# Patient Record
Sex: Female | Born: 1950
Health system: Southern US, Community
[De-identification: ages and names within clinical notes are randomized; demographics above are authoritative.]

## PROBLEM LIST (undated history)

## (undated) DIAGNOSIS — E669 Obesity, unspecified: Secondary | ICD-10-CM

## (undated) DIAGNOSIS — I10 Essential (primary) hypertension: Secondary | ICD-10-CM

## (undated) DIAGNOSIS — E559 Vitamin D deficiency, unspecified: Secondary | ICD-10-CM

## (undated) DIAGNOSIS — R7303 Prediabetes: Secondary | ICD-10-CM

## (undated) DIAGNOSIS — E785 Hyperlipidemia, unspecified: Secondary | ICD-10-CM

## (undated) HISTORY — DX: Obesity, unspecified: E66.9

## (undated) HISTORY — DX: Prediabetes: R73.03

## (undated) HISTORY — PX: COLONOSCOPY: SHX174

## (undated) HISTORY — DX: Hyperlipidemia, unspecified: E78.5

## (undated) HISTORY — DX: Vitamin D deficiency, unspecified: E55.9

## (undated) HISTORY — DX: Essential (primary) hypertension: I10

## (undated) HISTORY — PX: ENDOMETRIAL ABLATION: SHX621

---

## 2003-06-05 ENCOUNTER — Ambulatory Visit (HOSPITAL_COMMUNITY): Admission: RE | Admit: 2003-06-05 | Discharge: 2003-06-05 | Payer: Self-pay

## 2004-08-11 ENCOUNTER — Ambulatory Visit: Payer: Self-pay | Admitting: Family Medicine

## 2004-09-08 ENCOUNTER — Ambulatory Visit: Payer: Self-pay | Admitting: Family Medicine

## 2004-10-09 ENCOUNTER — Ambulatory Visit: Payer: Self-pay | Admitting: Family Medicine

## 2004-11-08 ENCOUNTER — Ambulatory Visit: Payer: Self-pay | Admitting: Family Medicine

## 2004-11-20 ENCOUNTER — Ambulatory Visit: Payer: Self-pay | Admitting: Family Medicine

## 2004-12-09 ENCOUNTER — Ambulatory Visit: Payer: Self-pay | Admitting: Family Medicine

## 2004-12-10 ENCOUNTER — Ambulatory Visit: Payer: Self-pay

## 2005-01-09 ENCOUNTER — Ambulatory Visit: Payer: Self-pay | Admitting: Family Medicine

## 2005-02-08 ENCOUNTER — Ambulatory Visit: Payer: Self-pay | Admitting: Family Medicine

## 2005-03-11 ENCOUNTER — Ambulatory Visit: Payer: Self-pay | Admitting: Family Medicine

## 2005-04-10 ENCOUNTER — Ambulatory Visit: Payer: Self-pay | Admitting: Family Medicine

## 2005-05-11 ENCOUNTER — Ambulatory Visit: Payer: Self-pay | Admitting: Family Medicine

## 2005-07-01 ENCOUNTER — Ambulatory Visit: Payer: Self-pay | Admitting: Family Medicine

## 2007-02-24 ENCOUNTER — Ambulatory Visit: Payer: Self-pay | Admitting: Family Medicine

## 2008-10-24 ENCOUNTER — Ambulatory Visit: Payer: Self-pay | Admitting: Family Medicine

## 2010-09-04 ENCOUNTER — Ambulatory Visit: Payer: Self-pay | Admitting: Family Medicine

## 2010-12-16 DIAGNOSIS — IMO0002 Reserved for concepts with insufficient information to code with codable children: Secondary | ICD-10-CM | POA: Insufficient documentation

## 2012-09-08 DIAGNOSIS — N951 Menopausal and female climacteric states: Secondary | ICD-10-CM | POA: Insufficient documentation

## 2012-09-23 ENCOUNTER — Ambulatory Visit: Payer: Self-pay | Admitting: Family Medicine

## 2014-04-18 ENCOUNTER — Ambulatory Visit: Payer: Self-pay | Admitting: Family Medicine

## 2014-04-27 ENCOUNTER — Ambulatory Visit: Payer: Self-pay | Admitting: Family Medicine

## 2014-12-11 ENCOUNTER — Other Ambulatory Visit: Payer: Self-pay | Admitting: Family Medicine

## 2015-01-10 ENCOUNTER — Telehealth: Payer: Self-pay | Admitting: Family Medicine

## 2015-01-10 ENCOUNTER — Other Ambulatory Visit: Payer: Self-pay | Admitting: Family Medicine

## 2015-01-10 DIAGNOSIS — Z0000 Encounter for general adult medical examination without abnormal findings: Secondary | ICD-10-CM

## 2015-01-10 NOTE — Telephone Encounter (Signed)
Sent rx to pharmacy and put in referral for mammogram

## 2015-01-10 NOTE — Telephone Encounter (Signed)
Requesting refill on Losartan, please send to [facility]. Last seen on 02-21-14 but does have her annual physical scheduled for 02/26/15 @11a . Requesting a mammogram referral as well

## 2015-01-11 MED ORDER — LOSARTAN POTASSIUM-HCTZ 100-12.5 MG PO TABS: 1.0000 | ORAL_TABLET | Freq: Every day | ORAL | Status: DC

## 2015-01-11 NOTE — Telephone Encounter (Signed)
Patient informed. 

## 2015-02-08 ENCOUNTER — Telehealth: Payer: Self-pay | Admitting: Family Medicine

## 2015-02-08 ENCOUNTER — Other Ambulatory Visit: Payer: Self-pay | Admitting: Family Medicine

## 2015-02-08 DIAGNOSIS — N6459 Other signs and symptoms in breast: Secondary | ICD-10-CM

## 2015-02-08 NOTE — Telephone Encounter (Signed)
Spoke with pt and sent new order for her.

## 2015-02-08 NOTE — Telephone Encounter (Signed)
Requesting return call pertaining to the mammogram appointment. She said in addition to the diagnostic they are also needing the okay to do the right breast ultrasound, because they always do the two together.

## 2015-02-08 NOTE — Telephone Encounter (Signed)
Sent order for ultrasound.

## 2015-02-08 NOTE — Telephone Encounter (Signed)
Patient received a letter December 2015 to due a 6 month follow up for diagnostic right breast. An order was placed sept 2016 for her annual. This should be cancelled and her diagnostic should be scheduled. Please return patient call once this is completed.

## 2015-02-11 ENCOUNTER — Telehealth: Payer: Self-pay | Admitting: Family Medicine

## 2015-02-11 ENCOUNTER — Other Ambulatory Visit: Payer: Self-pay | Admitting: Internal Medicine

## 2015-02-11 DIAGNOSIS — N6459 Other signs and symptoms in breast: Secondary | ICD-10-CM

## 2015-02-11 NOTE — Telephone Encounter (Signed)
Spoke with pt and called Levada Dy at [facility] to get the correct order sent so that she may schedule her ultrasound

## 2015-02-11 NOTE — Telephone Encounter (Signed)
Pt is needing to speak with Tashia regarding the scheduling of her mammogram.  Please call patient as soon as possible.

## 2015-02-15 ENCOUNTER — Ambulatory Visit
Admission: RE | Admit: 2015-02-15 | Discharge: 2015-02-15 | Disposition: A | Discharge status: Home / Self Care | Payer: BC Managed Care – PPO | Source: Ambulatory Visit | Attending: Family Medicine | Admitting: Family Medicine

## 2015-02-15 ENCOUNTER — Other Ambulatory Visit: Payer: Self-pay | Admitting: Family Medicine

## 2015-02-15 DIAGNOSIS — N6459 Other signs and symptoms in breast: Secondary | ICD-10-CM

## 2015-02-15 DIAGNOSIS — R928 Other abnormal and inconclusive findings on diagnostic imaging of breast: Secondary | ICD-10-CM | POA: Diagnosis not present

## 2015-02-15 DIAGNOSIS — N6489 Other specified disorders of breast: Secondary | ICD-10-CM | POA: Diagnosis present

## 2015-02-26 ENCOUNTER — Encounter: Payer: Self-pay | Admitting: Family Medicine

## 2015-03-12 ENCOUNTER — Other Ambulatory Visit: Payer: Self-pay | Admitting: Emergency Medicine

## 2015-03-12 MED ORDER — LOSARTAN POTASSIUM-HCTZ 100-12.5 MG PO TABS: 1.0000 | ORAL_TABLET | Freq: Every day | ORAL | Status: DC

## 2015-03-18 ENCOUNTER — Telehealth: Payer: Self-pay | Admitting: Family Medicine

## 2015-03-18 DIAGNOSIS — Z0000 Encounter for general adult medical examination without abnormal findings: Secondary | ICD-10-CM

## 2015-03-18 NOTE — Telephone Encounter (Signed)
Pt would like to know if she can get a laborder ready for her to pick up. She has a CPE scheduled for 03/21/15 and wants to go get her labs done.

## 2015-03-19 NOTE — Telephone Encounter (Signed)
Lab order printed and placed in file cabinet to be picked up. Please inform pt she must be fasting 8 hours prior to giving blood.

## 2015-03-19 NOTE — Telephone Encounter (Signed)
Pt informed

## 2015-03-21 ENCOUNTER — Other Ambulatory Visit: Payer: Self-pay | Admitting: Emergency Medicine

## 2015-03-21 ENCOUNTER — Encounter: Payer: Self-pay | Admitting: Family Medicine

## 2015-03-21 NOTE — Telephone Encounter (Signed)
Patient appointment was reschedule. 1 month refill called in

## 2015-04-14 ENCOUNTER — Other Ambulatory Visit: Payer: Self-pay | Admitting: Family Medicine

## 2015-04-25 ENCOUNTER — Encounter: Payer: Self-pay | Admitting: Family Medicine

## 2015-04-25 ENCOUNTER — Ambulatory Visit (INDEPENDENT_AMBULATORY_CARE_PROVIDER_SITE_OTHER): Payer: BC Managed Care – PPO | Admitting: Family Medicine

## 2015-04-25 VITALS — BP 120/64 | HR 85 | Temp 98.6°F | Resp 18 | Ht 62.0 in | Wt 195.4 lb

## 2015-04-25 DIAGNOSIS — I10 Essential (primary) hypertension: Secondary | ICD-10-CM

## 2015-04-25 DIAGNOSIS — N951 Menopausal and female climacteric states: Secondary | ICD-10-CM | POA: Diagnosis not present

## 2015-04-25 DIAGNOSIS — Z1211 Encounter for screening for malignant neoplasm of colon: Secondary | ICD-10-CM | POA: Diagnosis not present

## 2015-04-25 DIAGNOSIS — D219 Benign neoplasm of connective and other soft tissue, unspecified: Secondary | ICD-10-CM | POA: Insufficient documentation

## 2015-04-25 DIAGNOSIS — Z0000 Encounter for general adult medical examination without abnormal findings: Secondary | ICD-10-CM | POA: Diagnosis not present

## 2015-04-25 DIAGNOSIS — E668 Other obesity: Secondary | ICD-10-CM | POA: Diagnosis not present

## 2015-04-25 DIAGNOSIS — Z1239 Encounter for other screening for malignant neoplasm of breast: Secondary | ICD-10-CM | POA: Diagnosis not present

## 2015-04-25 DIAGNOSIS — D250 Submucous leiomyoma of uterus: Secondary | ICD-10-CM

## 2015-04-25 DIAGNOSIS — E1159 Type 2 diabetes mellitus with other circulatory complications: Secondary | ICD-10-CM | POA: Insufficient documentation

## 2015-04-25 DIAGNOSIS — IMO0002 Reserved for concepts with insufficient information to code with codable children: Secondary | ICD-10-CM

## 2015-04-25 DIAGNOSIS — Z780 Asymptomatic menopausal state: Secondary | ICD-10-CM | POA: Diagnosis not present

## 2015-04-25 MED ORDER — LOSARTAN POTASSIUM-HCTZ 100-12.5 MG PO TABS: 1.0000 | ORAL_TABLET | Freq: Every day | ORAL | Status: DC

## 2015-04-25 NOTE — Progress Notes (Signed)
Name: Madeline Pitts   MRN: [id number]    DOB: [birth year]   Date:04/25/2015       Progress Note  Subjective  Chief Complaint  Chief Complaint  Patient presents with  . Annual Exam    HPI   63 year old presenting for annual H&P. Baseline problems are stable  Past Medical History  Diagnosis Date  . Obesity   . Hypertension   . Vitamin D deficiency     Social History  Substance Use Topics  . Smoking status: Never Smoker   . Smokeless tobacco: Not on file  . Alcohol Use: No     Current outpatient prescriptions:  .  estrogen, conjugated,-medroxyprogesterone (PREMPRO) 0.45-1.5 MG tablet, Take by mouth., Disp: , Rfl:  .  Ascorbic Acid (VITAMIN C) 1000 MG tablet, Take by mouth., Disp: , Rfl:  .  losartan-hydrochlorothiazide (HYZAAR) 100-12.5 MG tablet, TAKE 1 TABLET BY MOUTH EVERY DAY, Disp: 30 tablet, Rfl: 0 .  Multiple Vitamin (MULTI-VITAMINS) TABS, Take by mouth., Disp: , Rfl:  .  Omega-3 Fatty Acids (FISH OIL) 1000 MG CAPS, Take by mouth., Disp: , Rfl:   Allergies  Allergen Reactions  . Penicillins Nausea Only    Review of Systems  Constitutional: Negative for fever, chills and weight loss.       Modestly obese and in no acute distress  HENT: Positive for congestion. Negative for hearing loss, sore throat and tinnitus.   Eyes: Negative for blurred vision, double vision and redness.  Respiratory: Negative for cough, hemoptysis and shortness of breath.   Cardiovascular: Negative for chest pain, palpitations, orthopnea, claudication and leg swelling.  Gastrointestinal: Negative for heartburn, nausea, vomiting, diarrhea, constipation and blood in stool.  Genitourinary: Negative for dysuria, urgency, frequency and hematuria.  Musculoskeletal: Negative for myalgias, back pain, joint pain, falls and neck pain.  Skin: Negative for itching.  Neurological: Negative for dizziness, tingling, tremors, focal weakness, seizures, loss of consciousness, weakness and headaches.   Endo/Heme/Allergies: Does not bruise/bleed easily.  Psychiatric/Behavioral: Negative for depression and substance abuse. The patient is not nervous/anxious and does not have insomnia.      Objective  Filed Vitals:   04/25/15 1105  BP: 120/64  Pulse: 85  Temp: 98.6 F (37 C)  Resp: 18  Height: 5\' 2"  (1.575 m)  Weight: 195 lb 7 oz (88.65 kg)  SpO2: 96%     Physical Exam  Constitutional: She is oriented to person, place, and time.  Modestly obese and in no acute distress  HENT:  Head: Normocephalic.  Eyes: EOM are normal. Pupils are equal, round, and reactive to light.  Neck: Normal range of motion. No thyromegaly present.  Cardiovascular: Normal rate, regular rhythm and normal heart sounds.   No murmur heard. Pulmonary/Chest: Effort normal and breath sounds normal.  Breasts are without asymmetry dimpling masses or tenderness  Abdominal: Soft. Bowel sounds are normal.  Musculoskeletal: Normal range of motion. She exhibits no edema.  Neurological: She is alert and oriented to person, place, and time. No cranial nerve deficit. Gait normal.  Skin: Skin is warm and dry. No rash noted.  Psychiatric: Memory and affect normal.      Assessment & Plan   1. Annual physical exam  - CBC - Comprehensive Metabolic Panel (CMET) - Lipid panel - TSH -   2. Breast cancer screening  MM Digital Screening; Future  3. Colon cancer screening - Ambulatory referral to Gastroenterology  4. Essential hypertension Well-controlled  5. Submucous leiomyoma of uterus Per her gynecologist who  is following regularly and has had a recent Pap smear within the last year which was normal  6. Post menopausal syndrome   7. Menopausal symptom   8. Adult BMI 30+ Handout on diet and obesity

## 2015-04-26 ENCOUNTER — Telehealth: Payer: Self-pay | Admitting: Emergency Medicine

## 2015-04-26 LAB — COMPREHENSIVE METABOLIC PANEL
A/G RATIO: 1.8 (ref 1.1–2.5)
ALBUMIN: 4.4 g/dL (ref 3.6–4.8)
ALT: 23 IU/L (ref 0–32)
AST: 20 IU/L (ref 0–40)
Alkaline Phosphatase: 84 IU/L (ref 39–117)
BILIRUBIN TOTAL: 0.2 mg/dL (ref 0.0–1.2)
BUN / CREAT RATIO: 15 (ref 11–26)
BUN: 14 mg/dL (ref 8–27)
CHLORIDE: 97 mmol/L (ref 96–106)
CO2: 26 mmol/L (ref 18–29)
Calcium: 10 mg/dL (ref 8.7–10.3)
Creatinine, Ser: 0.92 mg/dL (ref 0.57–1.00)
GFR calc non Af Amer: 67 mL/min/{1.73_m2} (ref >59)
GFR, EST AFRICAN AMERICAN: 77 mL/min/{1.73_m2} (ref >59)
GLOBULIN, TOTAL: 2.4 g/dL (ref 1.5–4.5)
Glucose: 110 mg/dL — ABNORMAL HIGH (ref 65–99)
POTASSIUM: 4.4 mmol/L (ref 3.5–5.2)
SODIUM: 141 mmol/L (ref 134–144)
TOTAL PROTEIN: 6.8 g/dL (ref 6.0–8.5)

## 2015-04-26 LAB — LIPID PANEL
CHOL/HDL RATIO: 4.1 ratio (ref 0.0–4.4)
Cholesterol, Total: 203 mg/dL — ABNORMAL HIGH (ref 100–199)
HDL: 50 mg/dL (ref >39)
LDL CALC: 137 mg/dL — AB (ref 0–99)
Triglycerides: 80 mg/dL (ref 0–149)
VLDL Cholesterol Cal: 16 mg/dL (ref 5–40)

## 2015-04-26 LAB — TSH: TSH: 1.23 u[IU]/mL (ref 0.450–4.500)

## 2015-04-26 NOTE — Addendum Note (Signed)
Addended by: Lolita Rieger D on: 04/26/2015 09:43 AM   Modules accepted: Orders

## 2015-04-26 NOTE — Telephone Encounter (Signed)
Patient notified of lab results. Refused cholesterol medication at this time. Would like to try and bring down the numbers with diet and exercise.

## 2015-04-29 ENCOUNTER — Telehealth: Payer: Self-pay | Admitting: Gastroenterology

## 2015-04-29 ENCOUNTER — Other Ambulatory Visit: Payer: Self-pay

## 2015-04-29 NOTE — Telephone Encounter (Signed)
Gastroenterology Pre-Procedure Review  Request Date: 05-27-2014 Requesting Physician: Dr.   PATIENT REVIEW QUESTIONS: The patient responded to the following health history questions as indicated:    1. Are you having any GI issues? no 2. Do you have a personal history of Polyps? no 3. Do you have a family history of Colon Cancer or Polyps? no 4. Diabetes Mellitus? no 5. Joint replacements in the past 12 months?no 6. Major health problems in the past 3 months?no 7. Any artificial heart valves, MVP, or defibrillator?no    MEDICATIONS & ALLERGIES:    Patient reports the following regarding taking any anticoagulation/antiplatelet therapy:   Plavix, Coumadin, Eliquis, Xarelto, Lovenox, Pradaxa, Brilinta, or Effient? no Aspirin? no  Patient confirms/reports the following medications:  Current Outpatient Prescriptions  Medication Sig Dispense Refill   Ascorbic Acid (VITAMIN C) 1000 MG tablet Take by mouth.     estrogen, conjugated,-medroxyprogesterone (PREMPRO) 0.45-1.5 MG tablet Take by mouth.     losartan-hydrochlorothiazide (HYZAAR) 100-12.5 MG tablet Take 1 tablet by mouth daily. 30 tablet 6   Multiple Vitamin (MULTI-VITAMINS) TABS Take by mouth.     Omega-3 Fatty Acids (FISH OIL) 1000 MG CAPS Take by mouth.     No current facility-administered medications for this visit.    Patient confirms/reports the following allergies:  Allergies  Allergen Reactions   Penicillins Nausea Only    No orders of the defined types were placed in this encounter.    AUTHORIZATION INFORMATION Primary Insurance: 1D#: Group #:  Secondary Insurance: 1D#: Group #:  SCHEDULE INFORMATION: Date: 05-27-2014 Time: Location:[facility]

## 2015-05-22 ENCOUNTER — Telehealth: Payer: Self-pay | Admitting: Gastroenterology

## 2015-05-22 NOTE — Telephone Encounter (Signed)
Patient called to ask if Dr.Wohl was in network with her insurance [employer] i told her i was sure he was but she may want to check with her insurance first. Patient was scheduled for a colonoscopy 1/17 but decided to cancel and will call back to r/s

## 2015-05-28 ENCOUNTER — Encounter: Admission: RE | Payer: Self-pay | Source: Ambulatory Visit

## 2015-05-28 ENCOUNTER — Ambulatory Visit
Admission: RE | Admit: 2015-05-28 | Payer: BC Managed Care – PPO | Source: Ambulatory Visit | Admitting: Gastroenterology

## 2015-05-28 SURGERY — COLONOSCOPY WITH PROPOFOL
Anesthesia: General

## 2015-06-17 ENCOUNTER — Other Ambulatory Visit: Payer: Self-pay

## 2015-06-17 MED ORDER — LOSARTAN POTASSIUM-HCTZ 100-12.5 MG PO TABS: 1.0000 | ORAL_TABLET | Freq: Every day | ORAL | Status: DC

## 2015-09-24 ENCOUNTER — Telehealth: Payer: Self-pay | Admitting: Family Medicine

## 2015-09-24 NOTE — Telephone Encounter (Signed)
Appointment scheduled with Dr Ancil Boozer for June 20. Requesting refill on Losartan. Please [facility] to [facility]

## 2015-09-26 MED ORDER — LOSARTAN POTASSIUM-HCTZ 100-12.5 MG PO TABS: 1.0000 | ORAL_TABLET | Freq: Every day | ORAL | Status: DC

## 2015-10-22 ENCOUNTER — Ambulatory Visit
Admission: RE | Admit: 2015-10-22 | Discharge: 2015-10-22 | Disposition: A | Discharge status: Home / Self Care | Payer: BC Managed Care – PPO | Source: Ambulatory Visit | Attending: Family Medicine | Admitting: Family Medicine

## 2015-10-22 ENCOUNTER — Other Ambulatory Visit: Payer: Self-pay | Admitting: Family Medicine

## 2015-10-22 DIAGNOSIS — Z1231 Encounter for screening mammogram for malignant neoplasm of breast: Secondary | ICD-10-CM | POA: Diagnosis present

## 2015-10-22 DIAGNOSIS — Z1239 Encounter for other screening for malignant neoplasm of breast: Secondary | ICD-10-CM

## 2015-10-22 LAB — HM MAMMOGRAPHY: HM Mammogram: NORMAL (ref 0–4)

## 2015-10-29 ENCOUNTER — Ambulatory Visit: Payer: BC Managed Care – PPO | Admitting: Family Medicine

## 2015-10-29 ENCOUNTER — Other Ambulatory Visit: Payer: Self-pay

## 2015-10-29 MED ORDER — LOSARTAN POTASSIUM-HCTZ 100-12.5 MG PO TABS: 1.0000 | ORAL_TABLET | Freq: Every day | ORAL | Status: DC

## 2015-10-29 NOTE — Telephone Encounter (Signed)
Refill request was sent to Dr. Krichna Sowles for approval and submission.  

## 2015-12-03 ENCOUNTER — Ambulatory Visit: Payer: BC Managed Care – PPO | Admitting: Family Medicine

## 2016-04-09 ENCOUNTER — Telehealth: Payer: Self-pay | Admitting: Family Medicine

## 2016-04-09 NOTE — Telephone Encounter (Signed)
Patient has upcoming annual physical scheduled for feb (your first availability) she is requesting a refill on losartan 100-12.5mg . She has enough to last through January. [facility]ses [facility] [address] [employer]. Also requesting a return call she would like to know who she had her last colonoscopy with.

## 2016-04-09 NOTE — Telephone Encounter (Signed)
Refill of Losartan until her appointment.

## 2016-04-09 NOTE — Telephone Encounter (Signed)
Left voice message for patient to return call. °

## 2016-04-09 NOTE — Telephone Encounter (Signed)
Patient returned call and scheduled appointment for 05/26/16

## 2016-04-09 NOTE — Telephone Encounter (Signed)
I cannot see her for a CPE and follow up since she was a patient of Dr. Rutherford Nail and has not established with me yet. Please ask her to come in sooner for losartan refill. Thank you

## 2016-04-10 ENCOUNTER — Telehealth: Payer: Self-pay | Admitting: Family Medicine

## 2016-04-10 NOTE — Telephone Encounter (Signed)
Pt said that dr Rutherford Nail had set her up for a colonscopy the first of 2017 but she never had it done and now she is wanting to get this set up. She is scheduled to see dr Ancil Boozer but has not seen her yet but Marnette Burgess said that she thought that the one that [facility] set up could still be used. Please give pt a call

## 2016-04-13 NOTE — Telephone Encounter (Signed)
Referral has been sent to [facility] - Dr. Allen Norris.

## 2016-04-15 LAB — HM PAP SMEAR: HM Pap smear: NORMAL

## 2016-05-26 ENCOUNTER — Encounter: Payer: Self-pay | Admitting: Family Medicine

## 2016-05-26 ENCOUNTER — Other Ambulatory Visit: Payer: Self-pay | Admitting: Family Medicine

## 2016-05-26 ENCOUNTER — Ambulatory Visit (INDEPENDENT_AMBULATORY_CARE_PROVIDER_SITE_OTHER): Payer: BC Managed Care – PPO | Admitting: Family Medicine

## 2016-05-26 VITALS — BP 118/62 | HR 88 | Temp 98.5°F | Resp 16 | Ht 62.0 in | Wt 193.7 lb

## 2016-05-26 DIAGNOSIS — Z1211 Encounter for screening for malignant neoplasm of colon: Secondary | ICD-10-CM

## 2016-05-26 DIAGNOSIS — Z79899 Other long term (current) drug therapy: Secondary | ICD-10-CM | POA: Diagnosis not present

## 2016-05-26 DIAGNOSIS — E669 Obesity, unspecified: Secondary | ICD-10-CM

## 2016-05-26 DIAGNOSIS — I10 Essential (primary) hypertension: Secondary | ICD-10-CM

## 2016-05-26 DIAGNOSIS — IMO0001 Reserved for inherently not codable concepts without codable children: Secondary | ICD-10-CM

## 2016-05-26 DIAGNOSIS — Z131 Encounter for screening for diabetes mellitus: Secondary | ICD-10-CM | POA: Diagnosis not present

## 2016-05-26 DIAGNOSIS — E785 Hyperlipidemia, unspecified: Secondary | ICD-10-CM | POA: Diagnosis not present

## 2016-05-26 DIAGNOSIS — Z23 Encounter for immunization: Secondary | ICD-10-CM

## 2016-05-26 DIAGNOSIS — R739 Hyperglycemia, unspecified: Secondary | ICD-10-CM

## 2016-05-26 DIAGNOSIS — Z114 Encounter for screening for human immunodeficiency virus [HIV]: Secondary | ICD-10-CM

## 2016-05-26 DIAGNOSIS — Z1159 Encounter for screening for other viral diseases: Secondary | ICD-10-CM

## 2016-05-26 MED ORDER — LOSARTAN POTASSIUM-HCTZ 50-12.5 MG PO TABS: 1.0000 | ORAL_TABLET | Freq: Every day | ORAL | 1 refills | Status: DC

## 2016-05-26 NOTE — Progress Notes (Signed)
Name: Madeline Pitts   MRN: [id number]    DOB: 07-31-1950   Date:05/26/2016       Progress Note  Subjective  Chief Complaint  Chief Complaint  Patient presents with  . Medication Refill  . Hypertension    Denies any symptoms    HPI  HTN: she has been taking medication as prescribed, and denies side effects. No chest pain or palpitation. BP is towards low end of normal, she has been exercising and we will try to decrease dose of Losartan and recheck during her wellness in about 6 weeks.   Obesity: she has HTN and hyperglycemia, trying to lose weight.   Patient Active Problem List   Diagnosis Date Noted  . Fibroid 04/25/2015  . BP (high blood pressure) 04/25/2015  . Climacteric 09/08/2012  . Menopausal symptom 09/08/2012  . Adult BMI 30+ 12/16/2010    Past Surgical History:  Procedure Laterality Date  . ENDOMETRIAL ABLATION      Family History  Problem Relation Age of Onset  . Uterine cancer Mother   . Gout Father   . COPD Father     Social History   Social History  . Marital status: Married    Spouse name: N/A  . Number of children: N/A  . Years of education: N/A   Occupational History  . Not on file.   Social History Main Topics  . Smoking status: Never Smoker  . Smokeless tobacco: Never Used  . Alcohol use No  . Drug use: No  . Sexual activity: Yes    Partners: Male   Other Topics Concern  . Not on file   Social History Narrative  . No narrative on file     Current Outpatient Prescriptions:  .  estrogen, conjugated,-medroxyprogesterone (PREMPRO) 0.45-1.5 MG tablet, Take by mouth., Disp: , Rfl:  .  GLUCOSAMINE SULFATE-MSM PO, Take 2 tablets by mouth once., Disp: , Rfl:  .  Multiple Vitamin (MULTI-VITAMINS) TABS, Take by mouth., Disp: , Rfl:  .  Omega-3 Fatty Acids (FISH OIL) 1000 MG CAPS, Take 1 capsule by mouth 3 (three) times a week., Disp: , Rfl:  .  losartan-hydrochlorothiazide (HYZAAR) 50-12.5 MG tablet, Take 1 tablet by mouth daily.,  Disp: 90 tablet, Rfl: 1  Allergies  Allergen Reactions  . Codeine Nausea Only and Other (See Comments)    Makes patient feel lethargic-can't move  . Penicillins Nausea Only     ROS  Constitutional: Negative for fever or weight change.  Respiratory: Negative for cough and shortness of breath.   Cardiovascular: Negative for chest pain or palpitations.  Gastrointestinal: Negative for abdominal pain, no bowel changes.  Musculoskeletal: Negative for gait problem or joint swelling.  Skin: Negative for rash.  Neurological: Negative for dizziness or headache.  No other specific complaints in a complete review of systems (except as listed in HPI above).  Objective  Vitals:   05/26/16 1146  BP: 118/62  Pulse: 88  Resp: 16  Temp: 98.5 F (36.9 C)  TempSrc: Oral  SpO2: 96%  Weight: 193 lb 11.2 oz (87.9 kg)  Height: 5\' 2"  (1.575 m)    Body mass index is 35.43 kg/m.  Physical Exam  Constitutional: Patient appears well-developed and well-nourished. Obese No distress.  HEENT: head atraumatic, normocephalic, pupils equal and reactive to light, neck supple, throat within normal limits Cardiovascular: Normal rate, regular rhythm and normal heart sounds.  No murmur heard. No BLE edema. Pulmonary/Chest: Effort normal and breath sounds normal. No respiratory distress. Abdominal:  Soft.  There is no tenderness. Psychiatric: Patient has a normal mood and affect. behavior is normal. Judgment and thought content normal.  Recent Results (from the past 2160 hour(s))  HM PAP SMEAR     Status: None   Collection Time: 04/15/16 12:00 AM  Result Value Ref Range   HM Pap smear Normal     Comment: Madeline Pitts at [facility] office in system     PHQ2/9: Depression screen [facility] 2/9 05/26/2016 04/25/2015  Decreased Interest 0 0  Down, Depressed, Hopeless 0 0  PHQ - 2 Score 0 0     Fall Risk: Fall Risk  05/26/2016 04/25/2015  Falls in the past year? No No     Functional Status Survey: Is the patient  deaf or have difficulty hearing?: No Does the patient have difficulty seeing, even when wearing glasses/contacts?: No Does the patient have difficulty concentrating, remembering, or making decisions?: No Does the patient have difficulty walking or climbing stairs?: No Does the patient have difficulty dressing or bathing?: No Does the patient have difficulty doing errands alone such as visiting a doctor's office or shopping?: No    Assessment & Plan  1. Essential hypertension  - losartan-hydrochlorothiazide (HYZAAR) 50-12.5 MG tablet; Take 1 tablet by mouth daily.  Dispense: 90 tablet; Refill: 1 - COMPLETE METABOLIC PANEL WITH GFR - TSH - CBC with Differential/Platelet  2. Obesity, Class II, BMI 35-39.9, with comorbidity  - Hemoglobin A1c - Insulin, fasting - TSH  3. Long-term use of high-risk medication  Check labs  4. Diabetes mellitus screening  - Hemoglobin A1c - Insulin, fasting  5. Dyslipidemia  - Lipid panel  6. Hyperglycemia  - Hemoglobin A1c - Insulin, fasting  7. Need for hepatitis C screening test  - Hepatitis C antibody  8. Needs flu shot  Refused  9. Need for shingles vaccine  Not done  10. Encounter for screening for HIV  - HIV antibody  11. Colon cancer screening  We will give her Madeline Pitts number, referral was made 04/2015 but she did not go

## 2016-05-29 ENCOUNTER — Telehealth: Payer: Self-pay | Admitting: General Surgery

## 2016-05-29 NOTE — Telephone Encounter (Signed)
05-29-16 SPOKE WITH PATIENT.SHE WILL CALL BACK TO SCHEDULE AN APPOINTMENT WITH DR BYRNETT FOR A SCREENING COLONOSCOPY(? ANY PRIOR)NOT IN EPIC.STATE BCBS/MTH

## 2016-06-08 ENCOUNTER — Encounter: Payer: Self-pay | Admitting: *Deleted

## 2016-07-06 ENCOUNTER — Encounter: Payer: BC Managed Care – PPO | Admitting: Family Medicine

## 2016-08-17 ENCOUNTER — Telehealth: Payer: Self-pay | Admitting: Family Medicine

## 2016-08-21 LAB — CBC AND DIFFERENTIAL
HCT: 36 % (ref 36–46)
HEMOGLOBIN: 11.9 g/dL — AB (ref 12.0–16.0)
PLATELETS: 286 10*3/uL (ref 150–399)
WBC: 3.4 10*3/mL

## 2016-08-21 LAB — LIPID PANEL
CHOLESTEROL: 199 mg/dL (ref 0–200)
HDL: 47 mg/dL (ref 35–70)
LDL Cholesterol: 131 mg/dL
Triglycerides: 106 mg/dL (ref 40–160)

## 2016-08-21 LAB — BASIC METABOLIC PANEL
BUN: 17 mg/dL (ref 4–21)
Creatinine: 1 mg/dL (ref 0.5–1.1)
GLUCOSE: 107 mg/dL
POTASSIUM: 4.6 mmol/L (ref 3.4–5.3)
SODIUM: 143 mmol/L (ref 137–147)

## 2016-08-21 LAB — HEMOGLOBIN A1C: Hemoglobin A1C: 6.1

## 2016-08-21 LAB — TSH: TSH: 1.16 u[IU]/mL (ref 0.41–5.90)

## 2016-08-26 ENCOUNTER — Telehealth: Payer: Self-pay

## 2016-08-26 ENCOUNTER — Encounter: Payer: Self-pay | Admitting: Family Medicine

## 2016-08-26 ENCOUNTER — Ambulatory Visit (INDEPENDENT_AMBULATORY_CARE_PROVIDER_SITE_OTHER): Payer: BC Managed Care – PPO | Admitting: Family Medicine

## 2016-08-26 VITALS — BP 122/68 | HR 100 | Temp 98.1°F | Resp 16 | Ht 62.0 in | Wt 203.4 lb

## 2016-08-26 DIAGNOSIS — E785 Hyperlipidemia, unspecified: Secondary | ICD-10-CM | POA: Insufficient documentation

## 2016-08-26 DIAGNOSIS — Z1239 Encounter for other screening for malignant neoplasm of breast: Secondary | ICD-10-CM

## 2016-08-26 DIAGNOSIS — Z01419 Encounter for gynecological examination (general) (routine) without abnormal findings: Secondary | ICD-10-CM

## 2016-08-26 DIAGNOSIS — I10 Essential (primary) hypertension: Secondary | ICD-10-CM | POA: Diagnosis not present

## 2016-08-26 DIAGNOSIS — Z1231 Encounter for screening mammogram for malignant neoplasm of breast: Secondary | ICD-10-CM

## 2016-08-26 DIAGNOSIS — R7303 Prediabetes: Secondary | ICD-10-CM | POA: Insufficient documentation

## 2016-08-26 MED ORDER — ASPIRIN EC 81 MG PO TBEC: 81.0000 mg | DELAYED_RELEASE_TABLET | Freq: Every day | ORAL | 0 refills | Status: DC

## 2016-08-26 MED ORDER — LOSARTAN POTASSIUM-HCTZ 50-12.5 MG PO TABS: 1.0000 | ORAL_TABLET | Freq: Every day | ORAL | 1 refills | Status: DC

## 2016-08-26 NOTE — Telephone Encounter (Signed)
Labs were drawn at [facility] on 08/21/16 and put in quick abstract. Please review and reply back with results. Thanks

## 2016-08-26 NOTE — Progress Notes (Addendum)
Name: Madeline Pitts   MRN: [id number]    DOB: 1950/09/30   Date:08/26/2016       Progress Note  Subjective  Chief Complaint  Chief Complaint  Patient presents with  . Annual Exam    HPI  Well Woman: she is married, sexually active, no vaginal discharge, no pain during intercourse ( on Prempro - given by Dr. Marcina Millard bladder problems, no breast lumps. She will colonoscopy and mammogram this Summer. She has pre-diabetes ( reviewed labs with patient ), she does not any more medications at this time but will try to resume a diabetic diet and avoid sweet beverages and we will recheck labs next visit   Patient Active Problem List   Diagnosis Date Noted  . Pre-diabetes 08/26/2016  . Dyslipidemia 08/26/2016  . Fibroid 04/25/2015  . BP (high blood pressure) 04/25/2015  . Climacteric 09/08/2012  . Menopausal symptom 09/08/2012  . Adult BMI 30+ 12/16/2010    Past Surgical History:  Procedure Laterality Date  . ENDOMETRIAL ABLATION      Family History  Problem Relation Age of Onset  . Uterine cancer Mother   . Gout Father   . COPD Father   . Emphysema Father     Was a heavy smoker  . Lung cancer Brother     smoker    Social History   Social History  . Marital status: Married    Spouse name: N/A  . Number of children: N/A  . Years of education: N/A   Occupational History  . Not on file.   Social History Main Topics  . Smoking status: Never Smoker  . Smokeless tobacco: Never Used  . Alcohol use No  . Drug use: No  . Sexual activity: Yes    Partners: Male   Other Topics Concern  . Not on file   Social History Narrative  . No narrative on file     Current Outpatient Prescriptions:  .  cholecalciferol (VITAMIN D) 1000 units tablet, Take 1,000 Units by mouth 2 (two) times a week., Disp: , Rfl:  .  estrogen, conjugated,-medroxyprogesterone (PREMPRO) 0.45-1.5 MG tablet, Take by mouth., Disp: , Rfl:  .  GLUCOSAMINE SULFATE-MSM PO, Take 2 tablets by mouth  once., Disp: , Rfl:  .  losartan-hydrochlorothiazide (HYZAAR) 50-12.5 MG tablet, Take 1 tablet by mouth daily., Disp: 90 tablet, Rfl: 1 .  Multiple Vitamin (MULTI-VITAMINS) TABS, Take by mouth., Disp: , Rfl:  .  Omega-3 Fatty Acids (FISH OIL) 1000 MG CAPS, Take 1 capsule by mouth 3 (three) times a week., Disp: , Rfl:  .  aspirin EC 81 MG tablet, Take 1 tablet (81 mg total) by mouth daily., Disp: 30 tablet, Rfl: 0  Allergies  Allergen Reactions  . Codeine Nausea Only and Other (See Comments)    Makes patient feel lethargic-can't move  . Penicillins Nausea Only     ROS  Constitutional: Negative for fever or weight change.  Respiratory: Negative for cough and shortness of breath.   Cardiovascular: Negative for chest pain or palpitations.  Gastrointestinal: Negative for abdominal pain, no bowel changes.  Musculoskeletal: Negative for gait problem she has  joint swelling - 4th finger right hand after she hit on a door, improving now.  Skin: Negative for rash.  Neurological: Negative for dizziness or headache.  No other specific complaints in a complete review of systems (except as listed in HPI above).  Objective  Vitals:   08/26/16 1128  BP: 122/68  Pulse: 100  Resp: 16  Temp: 98.1 F (36.7 C)  TempSrc: Oral  SpO2: 98%  Weight: 203 lb 6.4 oz (92.3 kg)  Height: 5\' 2"  (1.575 m)    Body mass index is 37.2 kg/m.  Physical Exam   Constitutional: Patient appears well-developed and obese. No distress.  HENT: Head: Normocephalic and atraumatic. Ears: B TMs ok, no erythema or effusion; Nose: Nose normal. Mouth/Throat: Oropharynx is clear and moist. No oropharyngeal exudate.  Eyes: Conjunctivae and EOM are normal. Pupils are equal, round, and reactive to light. No scleral icterus.  Neck: Normal range of motion. Neck supple. No JVD present. No thyromegaly present.  Cardiovascular: Normal rate, regular rhythm and normal heart sounds.  No murmur heard. No BLE edema. Pulmonary/Chest:  Effort normal and breath sounds normal. No respiratory distress. Abdominal: Soft. Bowel sounds are normal, no distension. There is no tenderness. no masses Breast: no lumps or masses, no nipple discharge or rashes FEMALE GENITALIA:  Not done- sees gyn pap is up to date RECTAL: not done Musculoskeletal: Normal range of motion, no joint effusions. No gross deformities Neurological: he is alert and oriented to person, place, and time. No cranial nerve deficit. Coordination, balance, strength, speech and gait are normal.  Skin: Skin is warm and dry. No rash noted. No erythema.  Psychiatric: Patient has a normal mood and affect. behavior is normal. Judgment and thought content normal.  Recent Results (from the past 2160 hour(s))  Basic metabolic panel     Status: None   Collection Time: 08/21/16 12:00 AM  Result Value Ref Range   Glucose 107 mg/dL   BUN 17 4 - 21 mg/dL   Creatinine 1.0 0.5 - 1.1 mg/dL   Potassium 4.6 3.4 - 5.3 mmol/L   Sodium 143 137 - 147 mmol/L  Lipid panel     Status: None   Collection Time: 08/21/16 12:00 AM  Result Value Ref Range   Triglycerides 106 40 - 160 mg/dL   Cholesterol 199 0 - 200 mg/dL   HDL 47 35 - 70 mg/dL   LDL Cholesterol 131 mg/dL  Hemoglobin A1c     Status: None   Collection Time: 08/21/16 12:00 AM  Result Value Ref Range   Hemoglobin A1C 6.1   TSH     Status: None   Collection Time: 08/21/16 12:00 AM  Result Value Ref Range   TSH 1.16 0.41 - 5.90 uIU/mL  CBC and differential     Status: Abnormal   Collection Time: 08/21/16 12:00 AM  Result Value Ref Range   Hemoglobin 11.9 (A) 12.0 - 16.0 g/dL   HCT 36 36 - 46 %   Platelets 286 150 - 399 K/L   WBC 3.4 10^3/mL     PHQ2/9: Depression screen [facility] 2/9 08/26/2016 05/26/2016 04/25/2015  Decreased Interest 0 0 0  Down, Depressed, Hopeless 0 0 0  PHQ - 2 Score 0 0 0     Fall Risk: Fall Risk  08/26/2016 05/26/2016 04/25/2015  Falls in the past year? No No No   Functional Status  Survey: Is the patient deaf or have difficulty hearing?: No Does the patient have difficulty seeing, even when wearing glasses/contacts?: No Does the patient have difficulty concentrating, remembering, or making decisions?: No Does the patient have difficulty walking or climbing stairs?: No Does the patient have difficulty dressing or bathing?: No Does the patient have difficulty doing errands alone such as visiting a doctor's office or shopping?: No    Assessment & Plan  1. Well woman exam  Discussed  importance of 150 minutes of physical activity weekly, eat two servings of fish weekly, eat one serving of tree nuts ( cashews, pistachios, pecans, almonds.Marland Kitchen) every other day, eat 6 servings of fruit/vegetables daily and drink plenty of water and avoid sweet beverages.  -EKG  2. Breast cancer screening  - MM Digital Screening; Future  3. Essential hypertension  - losartan-hydrochlorothiazide (HYZAAR) 50-12.5 MG tablet; Take 1 tablet by mouth daily.  Dispense: 90 tablet; Refill: 1 - EKG 12-Lead

## 2016-08-26 NOTE — Addendum Note (Signed)
Addended by: Steele Sizer F on: 08/26/2016 12:20 PM   Modules accepted: Orders

## 2016-08-26 NOTE — Patient Instructions (Signed)

## 2016-10-02 NOTE — Telephone Encounter (Signed)
ERRENOUS °

## 2016-11-24 ENCOUNTER — Ambulatory Visit: Payer: BC Managed Care – PPO | Admitting: Family Medicine

## 2017-03-02 ENCOUNTER — Ambulatory Visit: Payer: BC Managed Care – PPO | Admitting: Family Medicine

## 2017-03-29 ENCOUNTER — Ambulatory Visit
Admission: RE | Admit: 2017-03-29 | Discharge: 2017-03-29 | Disposition: A | Discharge status: Home / Self Care | Payer: BC Managed Care – PPO | Source: Ambulatory Visit | Attending: Family Medicine | Admitting: Family Medicine

## 2017-03-29 DIAGNOSIS — Z1239 Encounter for other screening for malignant neoplasm of breast: Secondary | ICD-10-CM

## 2017-03-29 DIAGNOSIS — Z1231 Encounter for screening mammogram for malignant neoplasm of breast: Secondary | ICD-10-CM | POA: Diagnosis present

## 2017-06-03 ENCOUNTER — Other Ambulatory Visit: Payer: Self-pay | Admitting: Family Medicine

## 2017-06-03 DIAGNOSIS — I10 Essential (primary) hypertension: Secondary | ICD-10-CM

## 2017-06-03 NOTE — Telephone Encounter (Signed)
Hypertension medication [facility]  Last office visit pertaining to hypertension:  BP Readings from Last 3 Encounters:  08/26/16 122/68  05/26/16 118/62  04/25/15 120/64    Lab Results  Component Value Date   CREATININE 1.0 08/21/2016   BUN 17 08/21/2016   NA 143 08/21/2016   K 4.6 08/21/2016   CL 97 04/25/2015   CO2 26 04/25/2015     Follow up on 08/26/2016

## 2017-06-07 ENCOUNTER — Ambulatory Visit: Payer: BC Managed Care – PPO | Admitting: Family Medicine

## 2017-06-07 ENCOUNTER — Encounter: Payer: Self-pay | Admitting: Family Medicine

## 2017-06-07 VITALS — BP 132/62 | HR 98 | Temp 98.0°F | Resp 16 | Ht 62.0 in | Wt 201.2 lb

## 2017-06-07 DIAGNOSIS — E559 Vitamin D deficiency, unspecified: Secondary | ICD-10-CM | POA: Diagnosis not present

## 2017-06-07 DIAGNOSIS — I10 Essential (primary) hypertension: Secondary | ICD-10-CM

## 2017-06-07 DIAGNOSIS — R739 Hyperglycemia, unspecified: Secondary | ICD-10-CM | POA: Diagnosis not present

## 2017-06-07 DIAGNOSIS — E785 Hyperlipidemia, unspecified: Secondary | ICD-10-CM

## 2017-06-07 MED ORDER — LOSARTAN POTASSIUM-HCTZ 50-12.5 MG PO TABS: 1.0000 | ORAL_TABLET | Freq: Every day | ORAL | 1 refills | Status: DC

## 2017-06-07 MED ORDER — LOSARTAN POTASSIUM-HCTZ 50-12.5 MG PO TABS: 1.0000 | ORAL_TABLET | Freq: Every day | ORAL | 3 refills | Status: DC

## 2017-06-07 NOTE — Progress Notes (Signed)
Name: Madeline Pitts   MRN: [id number]    DOB: [birth year]   Date:06/07/2017       Progress Note  Subjective  Chief Complaint  Chief Complaint  Patient presents with  . Medication Refill  . Hypertension    Denies any symptoms  . Obesity    Has lost 2 pounds since last visit    HPI   HTN: she has been taking medication as prescribed, and denies side effects. No chest pain or palpitation. BP is at goal  Obesity: she has HTN and hyperglycemia, trying to lose weight, lost 2 lbs since last visit  Vitamin D deficiency: taking otc supplementation twice a week, denies fatigue  Pre-diabetes: last hgbA1C was 6.1%, she denies polydipsia, polyuria or polyphagia. Trying to avoid carbohydrates. .    Patient Active Problem List   Diagnosis Date Noted  . Pre-diabetes 08/26/2016  . Dyslipidemia 08/26/2016  . Fibroid 04/25/2015  . BP (high blood pressure) 04/25/2015  . Climacteric 09/08/2012  . Menopausal symptom 09/08/2012  . Adult BMI 30+ 12/16/2010    Past Surgical History:  Procedure Laterality Date  . ENDOMETRIAL ABLATION      Family History  Problem Relation Age of Onset  . Uterine cancer Mother   . Gout Father   . COPD Father   . Emphysema Father        Was a heavy smoker  . Lung cancer Brother        smoker  . Breast cancer Neg Hx     Social History   Socioeconomic History  . Marital status: Married    Spouse name: Not on file  . Number of children: Not on file  . Years of education: Not on file  . Highest education level: Not on file  Social Needs  . Financial resource strain: Not on file  . Food insecurity - worry: Not on file  . Food insecurity - inability: Not on file  . Transportation needs - medical: Not on file  . Transportation needs - non-medical: Not on file  Occupational History  . Not on file  Tobacco Use  . Smoking status: Never Smoker  . Smokeless tobacco: Never Used  Substance and Sexual Activity  . Alcohol use: No    Alcohol/week:  0.0 oz  . Drug use: No  . Sexual activity: Yes    Partners: Male  Other Topics Concern  . Not on file  Social History Narrative  . Not on file     Current Outpatient Medications:  .  aspirin EC 81 MG tablet, Take 1 tablet (81 mg total) by mouth daily., Disp: 30 tablet, Rfl: 0 .  cholecalciferol (VITAMIN D) 1000 units tablet, Take 1,000 Units by mouth 2 (two) times a week., Disp: , Rfl:  .  estrogen, conjugated,-medroxyprogesterone (PREMPRO) 0.45-1.5 MG tablet, Take by mouth., Disp: , Rfl:  .  GLUCOSAMINE SULFATE-MSM PO, Take 2 tablets by mouth once., Disp: , Rfl:  .  losartan-hydrochlorothiazide (HYZAAR) 50-12.5 MG tablet, Take 1 tablet by mouth daily., Disp: 90 tablet, Rfl: 1 .  Multiple Vitamin (MULTI-VITAMINS) TABS, Take by mouth., Disp: , Rfl:  .  Omega-3 Fatty Acids (FISH OIL) 1000 MG CAPS, Take 1 capsule by mouth 3 (three) times a week., Disp: , Rfl:   Allergies  Allergen Reactions  . Codeine Nausea Only and Other (See Comments)    Makes patient feel lethargic-can't move  . Penicillins Nausea Only     ROS  Constitutional: Negative for fever or weight  change.  Respiratory: Negative for cough and shortness of breath.   Cardiovascular: Negative for chest pain or palpitations.  Gastrointestinal: Negative for abdominal pain, no bowel changes.  Musculoskeletal: Negative for gait problem or joint swelling.  Skin: Negative for rash.  Neurological: Negative for dizziness or headache.  No other specific complaints in a complete review of systems (except as listed in HPI above).  Objective  Vitals:   06/07/17 1134  BP: 132/62  Pulse: 98  Resp: 16  Temp: 98 F (36.7 C)  TempSrc: Oral  SpO2: 96%  Weight: 201 lb 3.2 oz (91.3 kg)  Height: 5\' 2"  (1.575 m)    Body mass index is 36.8 kg/m.  Physical Exam  Constitutional: Patient appears well-developed and well-nourished. Obese No distress.  HEENT: head atraumatic, normocephalic, pupils equal and reactive to light, neck  supple, throat within normal limits Cardiovascular: Normal rate, regular rhythm and normal heart sounds.  No murmur heard. Trace  BLE edema. Pulmonary/Chest: Effort normal and breath sounds normal. No respiratory distress. Abdominal: Soft.  There is no tenderness. Psychiatric: Patient has a normal mood and affect. behavior is normal. Judgment and thought content normal.  PHQ2/9: Depression screen [facility] 2/9 06/07/2017 08/26/2016 05/26/2016 04/25/2015  Decreased Interest 0 0 0 0  Down, Depressed, Hopeless 0 0 0 0  PHQ - 2 Score 0 0 0 0     Fall Risk: Fall Risk  06/07/2017 08/26/2016 05/26/2016 04/25/2015  Falls in the past year? No No No No      Functional Status Survey: Is the patient deaf or have difficulty hearing?: No Does the patient have difficulty seeing, even when wearing glasses/contacts?: No Does the patient have difficulty concentrating, remembering, or making decisions?: No Does the patient have difficulty walking or climbing stairs?: No Does the patient have difficulty dressing or bathing?: No Does the patient have difficulty doing errands alone such as visiting a doctor's office or shopping?: No   Assessment & Plan  1. Essential hypertension  - losartan-hydrochlorothiazide (HYZAAR) 50-12.5 MG tablet; Take 1 tablet by mouth daily.  Dispense: 90 tablet; Refill: 3 - COMPLETE METABOLIC PANEL WITH GFR - CBC  2. Dyslipidemia  - Lipid panel  3. Hyperglycemia  - Hemoglobin A1c - Insulin, random  4. Vitamin D deficiency  - VITAMIN D 25 Hydroxy (Vit-D Deficiency, Fractures)

## 2017-07-23 ENCOUNTER — Telehealth: Payer: Self-pay | Admitting: Family Medicine

## 2017-07-23 DIAGNOSIS — Z1211 Encounter for screening for malignant neoplasm of colon: Secondary | ICD-10-CM

## 2017-07-23 NOTE — Telephone Encounter (Signed)
Melody with [facility] came by requesting that colonoscopy referral be put in for Madeline Pitts.  Patient is now ready to have the colonscopy.  Melody stated that once the referral is placed she will call patient back to schedule.

## 2017-07-23 NOTE — Telephone Encounter (Signed)
See documentation notes.

## 2017-07-26 ENCOUNTER — Encounter: Payer: Self-pay | Admitting: General Surgery

## 2017-07-26 NOTE — Telephone Encounter (Signed)
Referral has been sent to [facility] as requested.

## 2017-08-19 ENCOUNTER — Ambulatory Visit: Payer: Self-pay | Admitting: General Surgery

## 2017-08-31 ENCOUNTER — Telehealth: Payer: Self-pay | Admitting: Family Medicine

## 2017-08-31 NOTE — Telephone Encounter (Signed)
Okay to take Meratrim, not sure above Active-PK ( not familiar with ingredients)

## 2017-08-31 NOTE — Telephone Encounter (Signed)
Pt stopped by and dropped off 2 bottles of pills (Active-PK and Meratr). She wanted you to look at the ingredients and make sure this is okay for her to take along with her blood pressure medication. It is okay to call pt and leave message. Call on home or mobile number. She will stop by and pick the bottles up after you look over them.

## 2017-08-31 NOTE — Telephone Encounter (Signed)
Left a voicemail with Dr. Ancil Boozer instructions on otc medications and left the bottles up front for her to pick up.

## 2017-09-01 ENCOUNTER — Encounter: Payer: Self-pay | Admitting: *Deleted

## 2017-09-07 ENCOUNTER — Ambulatory Visit: Payer: BC Managed Care – PPO | Admitting: General Surgery

## 2017-09-07 ENCOUNTER — Encounter: Payer: Self-pay | Admitting: General Surgery

## 2017-09-07 VITALS — BP 148/82 | HR 72 | Resp 12 | Ht 62.0 in | Wt 200.0 lb

## 2017-09-07 DIAGNOSIS — Z1211 Encounter for screening for malignant neoplasm of colon: Secondary | ICD-10-CM | POA: Diagnosis not present

## 2017-09-07 MED ORDER — POLYETHYLENE GLYCOL 3350 17 GM/SCOOP PO POWD: 1.0000 | Freq: Once | ORAL | 0 refills | Status: AC

## 2017-09-07 NOTE — Patient Instructions (Addendum)
The patient is aware to call back for any questions or concerns.  Colonoscopy, Adult A colonoscopy is an exam to look at the entire large intestine. During the exam, a lubricated, bendable tube is inserted into the anus and then passed into the rectum, colon, and other parts of the large intestine. A colonoscopy is often done as a part of normal colorectal screening or in response to certain symptoms, such as anemia, persistent diarrhea, abdominal pain, and blood in the stool. The exam can help screen for and diagnose medical problems, including:  Tumors.  Polyps.  Inflammation.  Areas of bleeding.  Tell a health care provider about:  Any allergies you have.  All medicines you are taking, including vitamins, herbs, eye drops, creams, and over-the-counter medicines.  Any problems you or family members have had with anesthetic medicines.  Any blood disorders you have.  Any surgeries you have had.  Any medical conditions you have.  Any problems you have had passing stool. What are the risks? Generally, this is a safe procedure. However, problems may occur, including:  Bleeding.  A tear in the intestine.  A reaction to medicines given during the exam.  Infection (rare).  What happens before the procedure? Eating and drinking restrictions Follow instructions from your health care provider about eating and drinking, which may include:  A few days before the procedure - follow a low-fiber diet. Avoid nuts, seeds, dried fruit, raw fruits, and vegetables.  1-3 days before the procedure - follow a clear liquid diet. Drink only clear liquids, such as clear broth or bouillon, black coffee or tea, clear juice, clear soft drinks or sports drinks, gelatin dessert, and popsicles. Avoid any liquids that contain red or purple dye.  On the day of the procedure - do not eat or drink anything during the 2 hours before the procedure, or within the time period that your health care provider  recommends.  Bowel prep If you were prescribed an oral bowel prep to clean out your colon:  Take it as told by your health care provider. Starting the day before your procedure, you will need to drink a large amount of medicated liquid. The liquid will cause you to have multiple loose stools until your stool is almost clear or light green.  If your skin or anus gets irritated from diarrhea, you may use these to relieve the irritation: ? Medicated wipes, such as adult wet wipes with aloe and vitamin E. ? A skin soothing-product like petroleum jelly.  If you vomit while drinking the bowel prep, take a break for up to 60 minutes and then begin the bowel prep again. If vomiting continues and you cannot take the bowel prep without vomiting, call your health care provider.  General instructions  Ask your health care provider about changing or stopping your regular medicines. This is especially important if you are taking diabetes medicines or blood thinners.  Plan to have someone take you home from the hospital or clinic. What happens during the procedure?  An IV tube may be inserted into one of your veins.  You will be given medicine to help you relax (sedative).  To reduce your risk of infection: ? Your health care team will wash or sanitize their hands. ? Your anal area will be washed with soap.  You will be asked to lie on your side with your knees bent.  Your health care provider will lubricate a long, thin, flexible tube. The tube will have a camera and   a light on the end.  The tube will be inserted into your anus.  The tube will be gently eased through your rectum and colon.  Air will be delivered into your colon to keep it open. You may feel some pressure or cramping.  The camera will be used to take images during the procedure.  A small tissue sample may be removed from your body to be examined under a microscope (biopsy). If any potential problems are found, the tissue  will be sent to a lab for testing.  If small polyps are found, your health care provider may remove them and have them checked for cancer cells.  The tube that was inserted into your anus will be slowly removed. The procedure may vary among health care providers and hospitals. What happens after the procedure?  Your blood pressure, heart rate, breathing rate, and blood oxygen level will be monitored until the medicines you were given have worn off.  Do not drive for 24 hours after the exam.  You may have a small amount of blood in your stool.  You may pass gas and have mild abdominal cramping or bloating due to the air that was used to inflate your colon during the exam.  It is up to you to get the results of your procedure. Ask your health care provider, or the department performing the procedure, when your results will be ready. This information is not intended to replace advice given to you by your health care provider. Make sure you discuss any questions you have with your health care provider. Document Released: 04/24/2000 Document Revised: 02/26/2016 Document Reviewed: 07/09/2015 Elsevier Interactive Patient Education  [employer].  The patient is scheduled for a Colonoscopy at [facility] on 10/06/17. They are aware to call the day before to get their arrival time. She will stop her Fish Oil one week prior. she may continue her 81 mg Aspirin. Miralax prescription has been sent into the patient's pharmacy. The patient is aware of date and instructions.

## 2017-09-07 NOTE — Progress Notes (Signed)
Patient ID: Madeline Pitts, female   DOB: 29-Nov-1950, 67 y.o.   MRN: [id number]  Chief Complaint  Patient presents with  . Colonoscopy    HPI Madeline Pitts is a 67 y.o. female here today for a screening colonoscopy last completed by Dr Alveta Heimlich over 10 years ago. Denies any GI issues. Bowels move daily, no bleeding.  She is a retired [employer] officer, [employer].  HPI  Past Medical History:  Diagnosis Date  . Hypertension   . Obesity   . Vitamin D deficiency     Past Surgical History:  Procedure Laterality Date  . COLONOSCOPY  2009?   Dr Alveta Heimlich  . ENDOMETRIAL ABLATION      Family History  Problem Relation Age of Onset  . Uterine cancer Mother   . Gout Father   . COPD Father   . Emphysema Father        Was a heavy smoker  . Lung cancer Brother        smoker  . Lung cancer Brother   . Breast cancer Neg Hx     Social History Social History   Tobacco Use  . Smoking status: Never Smoker  . Smokeless tobacco: Never Used  Substance Use Topics  . Alcohol use: No    Alcohol/week: 0.0 oz  . Drug use: No    Allergies  Allergen Reactions  . Codeine Nausea Only and Other (See Comments)    Makes patient feel lethargic-can't move  . Penicillins Nausea Only    Current Outpatient Medications  Medication Sig Dispense Refill  . aspirin EC 81 MG tablet Take 1 tablet (81 mg total) by mouth daily. 30 tablet 0  . cholecalciferol (VITAMIN D) 1000 units tablet Take 1,000 Units by mouth 2 (two) times a week.    . estrogen, conjugated,-medroxyprogesterone (PREMPRO) 0.45-1.5 MG tablet Take by mouth.    Marland Kitchen GLUCOSAMINE SULFATE-MSM PO Take 2 tablets by mouth once.    Marland Kitchen losartan-hydrochlorothiazide (HYZAAR) 50-12.5 MG tablet Take 1 tablet by mouth daily. 90 tablet 3  . Multiple Vitamin (MULTI-VITAMINS) TABS Take by mouth.    . Omega-3 Fatty Acids (FISH OIL) 1000 MG CAPS Take 1 capsule by mouth 3 (three) times a week.    . zinc gluconate 50 MG tablet Take 50 mg by mouth 2 (two) times a  week.     No current facility-administered medications for this visit.     Review of Systems Review of Systems  Constitutional: Negative.   Respiratory: Negative.   Cardiovascular: Negative.   Gastrointestinal: Negative for blood in stool, constipation and diarrhea.    Blood pressure (!) 148/82, pulse 72, resp. rate 12, height 5\' 2"  (1.575 m), weight 200 lb (90.7 kg), SpO2 97 %.  Physical Exam Physical Exam  Constitutional: She appears well-developed and well-nourished.  Eyes: No scleral icterus.  Neck: Normal range of motion. Neck supple.  Cardiovascular: Normal rate, regular rhythm and normal heart sounds.  Pulmonary/Chest: Effort normal and breath sounds normal.  Skin: Skin is warm and dry.  Psychiatric: Her behavior is normal.    Data Reviewed Laboratory studies of August 21, 2016 showed a hemoglobin of 11.9 with a platelet count of 286,000, white blood cell count 3400. Basic metabolic panel of the same date was normal.  Creatinine 1.0.  Assessment    Candidate for screening colonoscopy.    Plan  Colonoscopy with possible biopsy/polypectomy prn: Information regarding the procedure, including its potential risks and complications (including but not limited to perforation of the  bowel, which may require emergency surgery to repair, and bleeding) was verbally given to the patient. Educational information regarding lower intestinal endoscopy was given to the patient. Written instructions for how to complete the bowel prep using Miralax were provided. The importance of drinking ample fluids to avoid dehydration as a result of the prep emphasized.  HPI, Physical Exam, Assessment and Plan have been scribed under the direction and in the presence of Robert Bellow, MD. Karie Fetch, RN  I have completed the exam and reviewed the above documentation for accuracy and completeness.  I agree with the above.  Haematologist has been used and any errors in dictation or  transcription are unintentional.  Hervey Ard, M.D., F.A.C.S.  The patient is scheduled for a Colonoscopy at [facility] on 10/06/17. They are aware to call the day before to get their arrival time. She will stop her Fish Oil one week prior. she may continue her 81 mg Aspirin. Miralax prescription has been sent into the patient's pharmacy. The patient is aware of date and instructions.  Documented by Lesly Rubenstein LPN  Karie Fetch M 09/07/2017, 2:30 PM

## 2017-09-08 DIAGNOSIS — Z1211 Encounter for screening for malignant neoplasm of colon: Secondary | ICD-10-CM | POA: Insufficient documentation

## 2017-09-10 ENCOUNTER — Encounter: Payer: BC Managed Care – PPO | Admitting: Family Medicine

## 2017-10-06 ENCOUNTER — Encounter
Admission: RE | Disposition: A | Discharge status: Home / Self Care | Payer: Self-pay | Source: Ambulatory Visit | Attending: General Surgery

## 2017-10-06 ENCOUNTER — Ambulatory Visit
Admission: RE | Admit: 2017-10-06 | Discharge: 2017-10-06 | Disposition: A | Discharge status: Home / Self Care | Payer: BC Managed Care – PPO | Source: Ambulatory Visit | Attending: General Surgery | Admitting: General Surgery

## 2017-10-06 ENCOUNTER — Encounter: Payer: Self-pay | Admitting: Certified Registered"

## 2017-10-06 ENCOUNTER — Ambulatory Visit: Payer: BC Managed Care – PPO | Admitting: Certified Registered"

## 2017-10-06 DIAGNOSIS — I10 Essential (primary) hypertension: Secondary | ICD-10-CM | POA: Diagnosis not present

## 2017-10-06 DIAGNOSIS — Z7982 Long term (current) use of aspirin: Secondary | ICD-10-CM | POA: Insufficient documentation

## 2017-10-06 DIAGNOSIS — K621 Rectal polyp: Secondary | ICD-10-CM | POA: Insufficient documentation

## 2017-10-06 DIAGNOSIS — D123 Benign neoplasm of transverse colon: Secondary | ICD-10-CM | POA: Diagnosis not present

## 2017-10-06 DIAGNOSIS — Z1211 Encounter for screening for malignant neoplasm of colon: Secondary | ICD-10-CM

## 2017-10-06 DIAGNOSIS — Z79899 Other long term (current) drug therapy: Secondary | ICD-10-CM | POA: Insufficient documentation

## 2017-10-06 DIAGNOSIS — K635 Polyp of colon: Secondary | ICD-10-CM | POA: Diagnosis not present

## 2017-10-06 DIAGNOSIS — D125 Benign neoplasm of sigmoid colon: Secondary | ICD-10-CM | POA: Diagnosis not present

## 2017-10-06 HISTORY — PX: COLONOSCOPY WITH PROPOFOL: SHX5780

## 2017-10-06 SURGERY — COLONOSCOPY WITH PROPOFOL
Anesthesia: General

## 2017-10-06 MED ORDER — GLYCOPYRROLATE 0.2 MG/ML IJ SOLN
INTRAMUSCULAR | Status: DC | PRN
  Administered 2017-10-06: 0.1 mg via INTRAVENOUS

## 2017-10-06 MED ORDER — PROPOFOL 10 MG/ML IV BOLUS
INTRAVENOUS | Status: DC | PRN
  Administered 2017-10-06: 100 mg via INTRAVENOUS

## 2017-10-06 MED ORDER — LIDOCAINE HCL (CARDIAC) PF 100 MG/5ML IV SOSY
PREFILLED_SYRINGE | INTRAVENOUS | Status: DC | PRN
  Administered 2017-10-06: 50 mg via INTRAVENOUS

## 2017-10-06 MED ORDER — PROPOFOL 500 MG/50ML IV EMUL
INTRAVENOUS | Status: DC | PRN
  Administered 2017-10-06: 100 ug/kg/min via INTRAVENOUS

## 2017-10-06 MED ORDER — SODIUM CHLORIDE 0.9 % IV SOLN
INTRAVENOUS | Status: DC
  Administered 2017-10-06: 10:00:00 via INTRAVENOUS

## 2017-10-06 MED ORDER — PROPOFOL 500 MG/50ML IV EMUL
INTRAVENOUS | Status: AC
  Filled 2017-10-06: qty 50

## 2017-10-06 MED ORDER — GLYCOPYRROLATE 0.2 MG/ML IJ SOLN
INTRAMUSCULAR | Status: AC
  Filled 2017-10-06: qty 1

## 2017-10-06 MED ORDER — LIDOCAINE HCL (PF) 2 % IJ SOLN
INTRAMUSCULAR | Status: AC
Start: 2017-10-06 — End: ?
  Filled 2017-10-06: qty 10

## 2017-10-06 NOTE — Anesthesia Post-op Follow-up Note (Signed)
Anesthesia QCDR form completed.        

## 2017-10-06 NOTE — Anesthesia Preprocedure Evaluation (Signed)
Anesthesia Evaluation  Patient identified by MRN, date of birth, ID band Patient awake    Reviewed: Allergy & Precautions, H&P , NPO status , Patient's Chart, lab work & pertinent test results, reviewed documented beta blocker date and time   Airway Mallampati: II   Neck ROM: full    Dental  (+) Poor Dentition   Pulmonary neg pulmonary ROS,    Pulmonary exam normal        Cardiovascular hypertension, On Medications negative cardio ROS Normal cardiovascular exam Rhythm:regular Rate:Normal     Neuro/Psych negative neurological ROS  negative psych ROS   GI/Hepatic negative GI ROS, Neg liver ROS,   Endo/Other  negative endocrine ROS  Renal/GU negative Renal ROS  negative genitourinary   Musculoskeletal   Abdominal   Peds  Hematology negative hematology ROS (+)   Anesthesia Other Findings Past Medical History: No date: Hypertension No date: Obesity No date: Vitamin D deficiency Past Surgical History: 2009?: COLONOSCOPY     Comment:  Dr Alveta Heimlich No date: ENDOMETRIAL ABLATION   Reproductive/Obstetrics negative OB ROS                             Anesthesia Physical Anesthesia Plan  ASA: II  Anesthesia Plan: General   Post-op Pain Management:    Induction:   PONV Risk Score and Plan:   Airway Management Planned:   Additional Equipment:   Intra-op Plan:   Post-operative Plan:   Informed Consent: I have reviewed the patients History and Physical, chart, labs and discussed the procedure including the risks, benefits and alternatives for the proposed anesthesia with the patient or authorized representative who has indicated his/her understanding and acceptance.   Dental Advisory Given  Plan Discussed with: CRNA  Anesthesia Plan Comments:         Anesthesia Quick Evaluation

## 2017-10-06 NOTE — Anesthesia Postprocedure Evaluation (Signed)
Anesthesia Post Note  Patient: Madeline Pitts  Procedure(s) Performed: COLONOSCOPY WITH PROPOFOL (N/A )  Patient location during evaluation: PACU Anesthesia Type: General Level of consciousness: awake and alert Pain management: pain level controlled Vital Signs Assessment: post-procedure vital signs reviewed and stable Respiratory status: spontaneous breathing, nonlabored ventilation, respiratory function stable and patient connected to nasal cannula oxygen Cardiovascular status: blood pressure returned to baseline and stable Postop Assessment: no apparent nausea or vomiting Anesthetic complications: no     Last Vitals:  Vitals:   10/06/17 1001 10/06/17 1059  BP: (!) 148/128 103/88  Pulse: 62 83  Resp: 16 20  Temp: 36.6 C 36.6 C  SpO2: 100% 100%    Last Pain:  Vitals:   10/06/17 1059  TempSrc: Tympanic  PainSc: 0-No pain                 Molli Barrows

## 2017-10-06 NOTE — Anesthesia Postprocedure Evaluation (Signed)
Anesthesia Post Note  Patient: Madeline Pitts  Procedure(s) Performed: COLONOSCOPY WITH PROPOFOL (N/A )  Patient location during evaluation: Endoscopy Anesthesia Type: General Level of consciousness: awake and alert, oriented and patient cooperative Pain management: satisfactory to patient Vital Signs Assessment: post-procedure vital signs reviewed and stable Respiratory status: spontaneous breathing and respiratory function stable Cardiovascular status: blood pressure returned to baseline and stable Postop Assessment: no headache, no backache, patient able to bend at knees, no apparent nausea or vomiting, adequate PO intake and able to ambulate Anesthetic complications: no     Last Vitals:  Vitals:   10/06/17 1001  BP: (!) 148/128  Pulse: 62  Resp: 16  Temp: 36.6 C  SpO2: 100%    Last Pain:  Vitals:   10/06/17 1001  TempSrc: Tympanic                 Madeline Pitts

## 2017-10-06 NOTE — Transfer of Care (Signed)
Immediate Anesthesia Transfer of Care Note  Patient: Madeline Pitts  Procedure(s) Performed: COLONOSCOPY WITH PROPOFOL (N/A )  Patient Location: Endoscopy Unit  Anesthesia Type:General  Level of Consciousness: awake, alert , oriented and patient cooperative  Airway & Oxygen Therapy: Patient Spontanous Breathing  Post-op Assessment: Report given to RN, Post -op Vital signs reviewed and stable and Patient moving all extremities  Post vital signs: Reviewed and stable  Last Vitals:  Vitals Value Taken Time  BP    Temp    Pulse 78 10/06/2017 11:02 AM  Resp 26 10/06/2017 11:02 AM  SpO2 100 % 10/06/2017 11:02 AM  Vitals shown include unvalidated device data.  Last Pain:  Vitals:   10/06/17 1001  TempSrc: Tympanic         Complications: No apparent anesthesia complications

## 2017-10-06 NOTE — Op Note (Signed)
[facility] Gastroenterology Patient Name: Madeline Pitts Procedure Date: 10/06/2017 10:07 AM MRN: [id number] Account #: [id number] Date of Birth: 02/10/1951 Admit Type: Outpatient Age: 67 Room: [facility] ENDO ROOM 1 Gender: Female Note Status: Finalized Procedure:            Colonoscopy Indications:          Screening for colorectal malignant neoplasm Providers:            Robert Bellow, MD Referring MD:         Bethena Roys. Sowles, MD (Referring MD) Medicines:            Monitored Anesthesia Care Complications:        No immediate complications. Procedure:            Pre-Anesthesia Assessment:                       - Prior to the procedure, a History and Physical was                        performed, and patient medications, allergies and                        sensitivities were reviewed. The patient's tolerance of                        previous anesthesia was reviewed.                       - The risks and benefits of the procedure and the                        sedation options and risks were discussed with the                        patient. All questions were answered and informed                        consent was obtained.                       After obtaining informed consent, the colonoscope was                        passed under direct vision. Throughout the procedure,                        the patient's blood pressure, pulse, and oxygen                        saturations were monitored continuously. The                        Colonoscope was introduced through the anus and                        advanced to the the cecum, identified by appendiceal                        orifice and ileocecal valve. The colonoscopy was  performed without difficulty. The patient tolerated the                        procedure well. The quality of the bowel preparation                        was excellent. Findings:      Three sessile polyps  were found in the rectum, sigmoid colon and       transverse colon. The polyps were 5 mm in size. These were biopsied with       a cold forceps for histology.      The retroflexed view of the distal rectum and anal verge was normal and       showed no anal or rectal abnormalities. Impression:           - Three 5 mm polyps in the rectum, in the sigmoid colon                        and in the transverse colon. Biopsied.                       - The distal rectum and anal verge are normal on                        retroflexion view. Recommendation:       - Telephone endoscopist for pathology results in 1 week. Procedure Code(s):    --- Professional ---                       [phone], Colonoscopy, flexible; with biopsy, single or                        multiple Diagnosis Code(s):    --- Professional ---                       Z12.11, Encounter for screening for malignant neoplasm                        of colon                       K62.1, Rectal polyp                       D12.5, Benign neoplasm of sigmoid colon                       D12.3, Benign neoplasm of transverse colon (hepatic                        flexure or splenic flexure) CPT copyright 2017 American Medical Association. All rights reserved. The codes documented in this report are preliminary and upon coder review may  be revised to meet current compliance requirements. Robert Bellow, MD 10/06/2017 10:56:11 AM This report has been signed electronically. Number of Addenda: 0 Note Initiated On: 10/06/2017 10:07 AM Scope Withdrawal Time: 0 hours 9 minutes 25 seconds  Total Procedure Duration: 0 hours 20 minutes 20 seconds       [facility]

## 2017-10-06 NOTE — H&P (Signed)
Madeline Pitts [id number] 07/03/1950     HPI: 67 year old woman for screening colonoscopy.  Tolerated prep well.  Medications Prior to Admission  Medication Sig Dispense Refill Last Dose  . aspirin EC 81 MG tablet Take 1 tablet (81 mg total) by mouth daily. 30 tablet 0 Past Week at Unknown time  . cholecalciferol (VITAMIN D) 1000 units tablet Take 1,000 Units by mouth 2 (two) times a week.   Past Week at Unknown time  . estrogen, conjugated,-medroxyprogesterone (PREMPRO) 0.45-1.5 MG tablet Take by mouth.   10/05/2017 at Unknown time  . losartan-hydrochlorothiazide (HYZAAR) 50-12.5 MG tablet Take 1 tablet by mouth daily. 90 tablet 3 10/05/2017 at Unknown time  . Multiple Vitamin (MULTI-VITAMINS) TABS Take by mouth.   Past Week at Unknown time  . Omega-3 Fatty Acids (FISH OIL) 1000 MG CAPS Take 1 capsule by mouth 3 (three) times a week.   Past Week at Unknown time  . zinc gluconate 50 MG tablet Take 50 mg by mouth 2 (two) times a week.   10/05/2017 at Unknown time  . GLUCOSAMINE SULFATE-MSM PO Take 2 tablets by mouth once.   Taking   Allergies  Allergen Reactions  . Codeine Nausea Only and Other (See Comments)    Makes patient feel lethargic-can't move  . Penicillins Nausea Only   Past Medical History:  Diagnosis Date  . Hypertension   . Obesity   . Vitamin D deficiency    Past Surgical History:  Procedure Laterality Date  . COLONOSCOPY  2009?   Dr Alveta Heimlich  . ENDOMETRIAL ABLATION     Social History   Socioeconomic History  . Marital status: Married    Spouse name: Not on file  . Number of children: Not on file  . Years of education: Not on file  . Highest education level: Not on file  Occupational History  . Not on file  Social Needs  . Financial resource strain: Not on file  . Food insecurity:    Worry: Not on file    Inability: Not on file  . Transportation needs:    Medical: Not on file    Non-medical: Not on file  Tobacco Use  . Smoking status: Never Smoker   . Smokeless tobacco: Never Used  Substance and Sexual Activity  . Alcohol use: No    Alcohol/week: 0.0 oz  . Drug use: No  . Sexual activity: Yes    Partners: Male  Lifestyle  . Physical activity:    Days per week: Not on file    Minutes per session: Not on file  . Stress: Not on file  Relationships  . Social connections:    Talks on phone: Not on file    Gets together: Not on file    Attends religious service: Not on file    Active member of club or organization: Not on file    Attends meetings of clubs or organizations: Not on file    Relationship status: Not on file  . Intimate partner violence:    Fear of current or ex partner: Not on file    Emotionally abused: Not on file    Physically abused: Not on file    Forced sexual activity: Not on file  Other Topics Concern  . Not on file  Social History Narrative  . Not on file   Social History   Social History Narrative  . Not on file     ROS: Negative.     PE: HEENT: Negative. Lungs:  Clear. Cardio: RR.Marland Kitchen  Assessment/Plan:  Proceed with planned endoscopy. [facility] 10/06/2017

## 2017-10-07 ENCOUNTER — Encounter: Payer: Self-pay | Admitting: General Surgery

## 2017-10-07 ENCOUNTER — Telehealth: Payer: Self-pay

## 2017-10-07 LAB — SURGICAL PATHOLOGY

## 2017-10-07 NOTE — Telephone Encounter (Signed)
Notified patient as instructed, patient pleased. Discussed follow-up appointments, patient agrees. Patient placed in recalls.   

## 2017-10-07 NOTE — Telephone Encounter (Signed)
-----   Message from Robert Bellow, MD sent at 10/07/2017  3:57 PM EDT ----- Please notify the patient polyps were fine. She should plan on a repeat exam in five years.  Thanks. Please place in recalls.

## 2017-10-18 ENCOUNTER — Encounter: Payer: BC Managed Care – PPO | Admitting: Family Medicine

## 2017-12-28 ENCOUNTER — Encounter: Payer: BC Managed Care – PPO | Admitting: Family Medicine

## 2018-03-04 ENCOUNTER — Other Ambulatory Visit: Payer: Self-pay

## 2018-03-04 MED ORDER — HYDROCHLOROTHIAZIDE 12.5 MG PO TABS: 12.5000 mg | ORAL_TABLET | Freq: Every day | ORAL | 0 refills | Status: DC

## 2018-03-04 MED ORDER — LOSARTAN POTASSIUM 50 MG PO TABS: 50.0000 mg | ORAL_TABLET | Freq: Every day | ORAL | 0 refills | Status: DC

## 2018-03-04 NOTE — Telephone Encounter (Signed)
[facility] is requesting to split the Losartan-HCTZ prescriptions due to being on backorder. Tee'd up separate prescriptions, please sign.

## 2018-03-23 ENCOUNTER — Encounter: Payer: BC Managed Care – PPO | Admitting: Family Medicine

## 2018-06-13 ENCOUNTER — Encounter: Payer: BC Managed Care – PPO | Admitting: Family Medicine

## 2018-06-15 ENCOUNTER — Encounter: Payer: BC Managed Care – PPO | Admitting: Family Medicine

## 2018-07-08 ENCOUNTER — Other Ambulatory Visit: Payer: Self-pay | Admitting: Family Medicine

## 2018-07-08 DIAGNOSIS — I10 Essential (primary) hypertension: Secondary | ICD-10-CM

## 2018-08-02 ENCOUNTER — Encounter: Payer: BC Managed Care – PPO | Admitting: Family Medicine

## 2018-09-09 ENCOUNTER — Telehealth: Payer: Self-pay | Admitting: Family Medicine

## 2018-09-09 DIAGNOSIS — Z1239 Encounter for other screening for malignant neoplasm of breast: Secondary | ICD-10-CM

## 2018-09-09 NOTE — Telephone Encounter (Signed)
Pt is requesting a referral for mammogram. She does have her annual cpe scheduled for August.

## 2018-09-12 NOTE — Telephone Encounter (Signed)
Patient notified orders have been entered and given [place] phone number. She was notified routine imaging has been put on hold but to call and keep checking when they will open back up.

## 2018-09-14 ENCOUNTER — Encounter: Payer: Self-pay | Admitting: Family Medicine

## 2018-09-21 NOTE — Telephone Encounter (Signed)
Please close chart. It will not allow me to do it sorry.

## 2018-11-24 DIAGNOSIS — E669 Obesity, unspecified: Secondary | ICD-10-CM | POA: Diagnosis not present

## 2018-11-24 DIAGNOSIS — N951 Menopausal and female climacteric states: Secondary | ICD-10-CM | POA: Diagnosis not present

## 2018-11-24 DIAGNOSIS — R87610 Atypical squamous cells of undetermined significance on cytologic smear of cervix (ASC-US): Secondary | ICD-10-CM | POA: Diagnosis not present

## 2018-11-24 DIAGNOSIS — R7303 Prediabetes: Secondary | ICD-10-CM | POA: Diagnosis not present

## 2018-11-24 DIAGNOSIS — Z124 Encounter for screening for malignant neoplasm of cervix: Secondary | ICD-10-CM | POA: Diagnosis not present

## 2018-11-24 DIAGNOSIS — Z01419 Encounter for gynecological examination (general) (routine) without abnormal findings: Secondary | ICD-10-CM | POA: Diagnosis not present

## 2018-11-24 DIAGNOSIS — Z79890 Hormone replacement therapy (postmenopausal): Secondary | ICD-10-CM | POA: Diagnosis not present

## 2018-11-24 DIAGNOSIS — D219 Benign neoplasm of connective and other soft tissue, unspecified: Secondary | ICD-10-CM | POA: Diagnosis not present

## 2018-12-09 ENCOUNTER — Other Ambulatory Visit: Payer: Self-pay | Admitting: Family Medicine

## 2018-12-09 ENCOUNTER — Telehealth: Payer: Self-pay | Admitting: Family Medicine

## 2018-12-09 DIAGNOSIS — E559 Vitamin D deficiency, unspecified: Secondary | ICD-10-CM | POA: Diagnosis not present

## 2018-12-09 DIAGNOSIS — I10 Essential (primary) hypertension: Secondary | ICD-10-CM | POA: Diagnosis not present

## 2018-12-09 DIAGNOSIS — R739 Hyperglycemia, unspecified: Secondary | ICD-10-CM | POA: Diagnosis not present

## 2018-12-09 DIAGNOSIS — E785 Hyperlipidemia, unspecified: Secondary | ICD-10-CM

## 2018-12-09 MED ORDER — LOSARTAN POTASSIUM-HCTZ 50-12.5 MG PO TABS: 1.0000 | ORAL_TABLET | Freq: Every day | ORAL | 0 refills | Status: DC

## 2018-12-09 NOTE — Telephone Encounter (Signed)
Pt has an appt for CPE on 12/29/2018 and only has enough BP meds until 12/14/2018.

## 2018-12-12 LAB — LIPID PANEL
Cholesterol: 243 mg/dL — ABNORMAL HIGH (ref <200)
HDL: 50 mg/dL (ref >=50)
LDL Cholesterol (Calc): 168 mg/dL (calc) — ABNORMAL HIGH
Non-HDL Cholesterol (Calc): 193 mg/dL (calc) — ABNORMAL HIGH (ref <130)
Total CHOL/HDL Ratio: 4.9 (calc) (ref <5.0)
Triglycerides: 118 mg/dL (ref <150)

## 2018-12-12 LAB — COMPLETE METABOLIC PANEL WITH GFR
AG Ratio: 1.8 (calc) (ref 1.0–2.5)
ALT: 16 U/L (ref 6–29)
AST: 15 U/L (ref 10–35)
Albumin: 4.4 g/dL (ref 3.6–5.1)
Alkaline phosphatase (APISO): 70 U/L (ref 37–153)
BUN/Creatinine Ratio: 17 (calc) (ref 6–22)
BUN: 17 mg/dL (ref 7–25)
CO2: 28 mmol/L (ref 20–32)
Calcium: 9.9 mg/dL (ref 8.6–10.4)
Chloride: 102 mmol/L (ref 98–110)
Creat: 1.02 mg/dL — ABNORMAL HIGH (ref 0.50–0.99)
GFR, Est African American: 66 mL/min/{1.73_m2} (ref >=60)
GFR, Est Non African American: 57 mL/min/{1.73_m2} — ABNORMAL LOW (ref >=60)
Globulin: 2.4 g/dL (calc) (ref 1.9–3.7)
Glucose, Bld: 106 mg/dL — ABNORMAL HIGH (ref 65–99)
Potassium: 4 mmol/L (ref 3.5–5.3)
Sodium: 140 mmol/L (ref 135–146)
Total Bilirubin: 0.4 mg/dL (ref 0.2–1.2)
Total Protein: 6.8 g/dL (ref 6.1–8.1)

## 2018-12-12 LAB — CBC
HCT: 36.8 % (ref 35.0–45.0)
Hemoglobin: 12.1 g/dL (ref 11.7–15.5)
MCH: 27.2 pg (ref 27.0–33.0)
MCHC: 32.9 g/dL (ref 32.0–36.0)
MCV: 82.7 fL (ref 80.0–100.0)
MPV: 9.7 fL (ref 7.5–12.5)
Platelets: 320 10*3/uL (ref 140–400)
RBC: 4.45 10*6/uL (ref 3.80–5.10)
RDW: 14.7 % (ref 11.0–15.0)
WBC: 3.1 10*3/uL — ABNORMAL LOW (ref 3.8–10.8)

## 2018-12-12 LAB — HEMOGLOBIN A1C
Hgb A1c MFr Bld: 6.1 % of total Hgb — ABNORMAL HIGH (ref <5.7)
Mean Plasma Glucose: 128 (calc)
eAG (mmol/L): 7.1 (calc)

## 2018-12-12 LAB — INSULIN, RANDOM: Insulin: 12.7 u[IU]/mL

## 2018-12-12 LAB — VITAMIN D 25 HYDROXY (VIT D DEFICIENCY, FRACTURES): Vit D, 25-Hydroxy: 36 ng/mL (ref 30–100)

## 2018-12-19 ENCOUNTER — Other Ambulatory Visit: Payer: Self-pay

## 2018-12-19 ENCOUNTER — Encounter: Payer: Self-pay | Admitting: Family Medicine

## 2018-12-19 ENCOUNTER — Ambulatory Visit (INDEPENDENT_AMBULATORY_CARE_PROVIDER_SITE_OTHER): Payer: PPO | Admitting: Family Medicine

## 2018-12-19 VITALS — BP 130/70 | HR 95 | Temp 97.1°F | Resp 16 | Ht 61.25 in | Wt 190.7 lb

## 2018-12-19 DIAGNOSIS — R739 Hyperglycemia, unspecified: Secondary | ICD-10-CM

## 2018-12-19 DIAGNOSIS — Z23 Encounter for immunization: Secondary | ICD-10-CM

## 2018-12-19 DIAGNOSIS — Z1231 Encounter for screening mammogram for malignant neoplasm of breast: Secondary | ICD-10-CM

## 2018-12-19 DIAGNOSIS — D708 Other neutropenia: Secondary | ICD-10-CM

## 2018-12-19 DIAGNOSIS — E785 Hyperlipidemia, unspecified: Secondary | ICD-10-CM | POA: Diagnosis not present

## 2018-12-19 DIAGNOSIS — Z0000 Encounter for general adult medical examination without abnormal findings: Secondary | ICD-10-CM

## 2018-12-19 DIAGNOSIS — R7303 Prediabetes: Secondary | ICD-10-CM | POA: Diagnosis not present

## 2018-12-19 DIAGNOSIS — E2839 Other primary ovarian failure: Secondary | ICD-10-CM | POA: Diagnosis not present

## 2018-12-19 DIAGNOSIS — E559 Vitamin D deficiency, unspecified: Secondary | ICD-10-CM

## 2018-12-19 DIAGNOSIS — I10 Essential (primary) hypertension: Secondary | ICD-10-CM

## 2018-12-19 MED ORDER — LOSARTAN POTASSIUM-HCTZ 50-12.5 MG PO TABS: 1.0000 | ORAL_TABLET | Freq: Every day | ORAL | 0 refills | Status: DC

## 2018-12-19 NOTE — Progress Notes (Signed)
Name: Madeline Pitts   MRN: [id number]    DOB: Feb 17, 1951   Date:12/19/2018       Progress Note  Subjective  Chief Complaint  Chief Complaint  Patient presents with  . Annual Exam    HPI   Patient presents for annual CPE and follow up  HTN: she has been taking medication as prescribed, and denies side effects. No chest pain or palpitation. BP is at goal today.   Leucopenia: mild drop of white count , we will recheck next visit.   Obesity: she has HTN and hyperglycemia, trying to lose weight, by eating healthier and also exercising since March 4-6 x per week for about 45 minutes per day . She is down 11 lbs since last year   Vitamin D deficiency: taking otc supplementation twice a week and last level at goal   Pre-diabetes: last hgbA1C was 6.1%, and also elevated insulin level she denies polydipsia, polyuria or polyphagia. Trying to avoid carbohydrates.   Hyperlipidemia: based on calculation below I recommended patient to start taking statins   The 10-year ASCVD risk score [name], et al., 2013) is: 12.1%   Values used to calculate the score:     Age: 68 years     Sex: Female     Is Non-Hispanic African American: Yes     Diabetic: No     Tobacco smoker: No     Systolic Blood Pressure: 017 mmHg     Is BP treated: Yes     HDL Cholesterol: 50 mg/dL     Total Cholesterol: 243 mg/dL   Diet: discussed high calcium diet, fish  Exercise: doing well with activity   USPSTF grade A and B recommendations    Office Visit from 12/19/2018 in [facility]  AUDIT-C Score  0    Hypertension: BP Readings from Last 3 Encounters:  12/19/18 130/70  10/06/17 103/88  09/07/17 (!) 148/82   Obesity: Wt Readings from Last 3 Encounters:  12/19/18 190 lb 11.2 oz (86.5 kg)  10/06/17 197 lb (89.4 kg)  09/07/17 200 lb (90.7 kg)   BMI Readings from Last 3 Encounters:  12/19/18 35.74 kg/m  10/06/17 36.03 kg/m  09/07/17 36.58 kg/m    Hep C Screening:  done 08/2016  STD testing and prevention (HIV/chl/gon/syphilis): N/a Intimate partner violence:negative  Sexual History/Pain during Intercourse: no pain during intercourse  Menstrual History/LMP/Abnormal Bleeding: on HRT, sees gyn  Incontinence Symptoms: no symptoms   Advanced Care Planning: A voluntary discussion about advance care planning including the explanation and discussion of advance directives.  Discussed health care proxy and Living will, and the patient was able to identify a health care proxy as husband .  Patient does not have a living will at present time.  Breast cancer screening: card given to patient to schedule it  BRCA gene screening: N/A Cervical cancer screening: N/A, sees GYN  Osteoporosis Screening: discussed bone density test, last one normal but it was 5 years ago   Lipids:  Lab Results  Component Value Date   CHOL 243 (H) 12/09/2018   CHOL 199 08/21/2016   CHOL 203 (H) 04/25/2015   Lab Results  Component Value Date   HDL 50 12/09/2018   HDL 47 08/21/2016   HDL 50 04/25/2015   Lab Results  Component Value Date   LDLCALC 168 (H) 12/09/2018   LDLCALC 131 08/21/2016   LDLCALC 137 (H) 04/25/2015   Lab Results  Component Value Date   TRIG 118  12/09/2018   TRIG 106 08/21/2016   TRIG 80 04/25/2015   Lab Results  Component Value Date   CHOLHDL 4.9 12/09/2018   CHOLHDL 4.1 04/25/2015   No results found for: LDLDIRECT  Glucose:  Glucose  Date Value Ref Range Status  04/25/2015 110 (H) 65 - 99 mg/dL Final   Glucose, Bld  Date Value Ref Range Status  12/09/2018 106 (H) 65 - 99 mg/dL Final    Comment:    .            Fasting reference interval . For someone without known diabetes, a glucose value between 100 and 125 mg/dL is consistent with prediabetes and should be confirmed with a follow-up test. .     Skin cancer: discussed atypical lesions Colorectal cancer: up to date    Lung cancer:   Low Dose CT Chest recommended if Age 67-80  years, 30 pack-year currently smoking OR have quit w/in 15years. Patient does not qualify.   ECG: 2018  Patient Active Problem List   Diagnosis Date Noted  . Pre-diabetes 08/26/2016  . Dyslipidemia 08/26/2016  . Fibroid 04/25/2015  . BP (high blood pressure) 04/25/2015  . Climacteric 09/08/2012  . Menopausal symptom 09/08/2012  . Adult BMI 30+ 12/16/2010    Past Surgical History:  Procedure Laterality Date  . COLONOSCOPY  2009?   Dr Alveta Heimlich  . COLONOSCOPY WITH PROPOFOL N/A 10/06/2017   Procedure: COLONOSCOPY WITH PROPOFOL;  Surgeon: Robert Bellow, MD;  Location: [facility] ENDOSCOPY;  Service: Endoscopy;  Laterality: N/A;  . ENDOMETRIAL ABLATION      Family History  Problem Relation Age of Onset  . Uterine cancer Mother   . Gout Father   . COPD Father   . Emphysema Father        Was a heavy smoker  . Lung cancer Brother        smoker  . Lung cancer Brother   . Breast cancer Neg Hx     Social History   Socioeconomic History  . Marital status: Married    Spouse name: Karle Starch   . Number of children: 2  . Years of education: Not on file  . Highest education level: Bachelor's degree (e.g., BA, AB, BS)  Occupational History  . 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 Financial resource strain: Not hard at all  . Food insecurity    Worry: Never true    Inability: Never true  . Transportation needs    Medical: No    Non-medical: No  Tobacco Use  . Smoking status: Never Smoker  . Smokeless tobacco: Never Used  Substance and Sexual Activity  . Alcohol use: No    Alcohol/week: 0.0 standard drinks  . Drug use: No  . Sexual activity: Yes    Partners: Male    Birth control/protection: Post-menopausal  Lifestyle  . Physical activity    Days per week: 5 days    Minutes per session: 50 min  . Stress: Not at all  Relationships  . Social connections    Talks on phone: More than three times a week    Gets together: More than three times a week     Attends religious service: More than 4 times per year    Active member of club or organization: Yes    Attends meetings of clubs or organizations: More than 4 times per year    Relationship status: Married  . Intimate partner violence    Fear  of current or ex partner: No    Emotionally abused: No    Physically abused: No    Forced sexual activity: No  Other Topics Concern  . Not on file  Social History Narrative  . Not on file     Current Outpatient Medications:  .  aspirin EC 81 MG tablet, Take 1 tablet (81 mg total) by mouth daily., Disp: 30 tablet, Rfl: 0 .  cholecalciferol (VITAMIN D) 1000 units tablet, Take 1,000 Units by mouth 2 (two) times a week., Disp: , Rfl:  .  estrogen, conjugated,-medroxyprogesterone (PREMPRO) 0.45-1.5 MG tablet, Take by mouth., Disp: , Rfl:  .  GLUCOSAMINE SULFATE-MSM PO, Take 2 tablets by mouth once., Disp: , Rfl:  .  losartan-hydrochlorothiazide (HYZAAR) 50-12.5 MG tablet, Take 1 tablet by mouth daily., Disp: 90 tablet, Rfl: 0 .  Multiple Vitamin (MULTI-VITAMINS) TABS, Take by mouth., Disp: , Rfl:  .  Omega-3 Fatty Acids (FISH OIL) 1000 MG CAPS, Take 1 capsule by mouth 3 (three) times a week., Disp: , Rfl:  .  zinc gluconate 50 MG tablet, Take 50 mg by mouth 2 (two) times a week., Disp: , Rfl:   Allergies  Allergen Reactions  . Codeine Nausea Only and Other (See Comments)    Makes patient feel lethargic-can't move  . Penicillins Nausea Only     ROS  Constitutional: Negative for fever or weight change.  Respiratory: Negative for cough and shortness of breath.   Cardiovascular: Negative for chest pain or palpitations.  Gastrointestinal: Negative for abdominal pain, no bowel changes.  Musculoskeletal: Negative for gait problem or joint swelling.  Skin: Negative for rash.  Neurological: Negative for dizziness or headache.  No other specific complaints in a complete review of systems (except as listed in HPI above).  Objective  Vitals:    12/19/18 1151  BP: 130/70  Pulse: 95  Resp: 16  Temp: (!) 97.1 F (36.2 C)  TempSrc: Temporal  SpO2: 98%  Weight: 190 lb 11.2 oz (86.5 kg)  Height: 5' 1.25" (1.556 m)    Body mass index is 35.74 kg/m.  Physical Exam  Constitutional: Patient appears well-developed and well-nourished. No distress.  HENT: Head: Normocephalic and atraumatic. Ears: B TMs ok, no erythema or effusion Eyes: Conjunctivae and EOM are normal. Pupils are equal, round, and reactive to light. No scleral icterus.  Neck: Normal range of motion. Neck supple. No JVD present. No thyromegaly present.  Cardiovascular: Normal rate, regular rhythm and normal heart sounds.  No murmur heard. No BLE edema. Pulmonary/Chest: Effort normal and breath sounds normal. No respiratory distress. Abdominal: Soft. Bowel sounds are normal, no distension. There is no tenderness. no masses Breast: no lumps or masses, no nipple discharge or rashes FEMALE GENITALIA:  Not done RECTAL: not done Musculoskeletal: Normal range of motion, no joint effusions. No gross deformities Neurological: he is alert and oriented to person, place, and time. No cranial nerve deficit. Coordination, balance, strength, speech and gait are normal.  Skin: Skin is warm and dry. No rash noted. No erythema.  Psychiatric: Patient has a normal mood and affect. behavior is normal. Judgment and thought content normal.  Recent Results (from the past 2160 hour(s))  Lipid panel     Status: Abnormal   Collection Time: 12/09/18 11:59 AM  Result Value Ref Range   Cholesterol 243 (H) <200 mg/dL   HDL 50 > OR = 50 mg/dL   Triglycerides 118 <150 mg/dL   LDL Cholesterol (Calc) 168 (H) mg/dL (calc)  Comment: Reference range: <100 . Desirable range <100 mg/dL for primary prevention;   <70 mg/dL for patients with CHD or diabetic patients  with > or = 2 CHD risk factors. Marland Kitchen LDL-C is now calculated using the Martin-Hopkins  calculation, which is a validated novel method  providing  better accuracy than the Friedewald equation in the  estimation of LDL-C.  Cresenciano Genre et al. Annamaria Helling. 4696;295(28): 2061-2068  (http://education.[facility].com/faq/FAQ164)    Total CHOL/HDL Ratio 4.9 <5.0 (calc)   Non-HDL Cholesterol (Calc) 193 (H) <130 mg/dL (calc)    Comment: For patients with diabetes plus 1 major ASCVD risk  factor, treating to a non-HDL-C goal of <100 mg/dL  (LDL-C of <70 mg/dL) is considered a therapeutic  option.   COMPLETE METABOLIC PANEL WITH GFR     Status: Abnormal   Collection Time: 12/09/18 11:59 AM  Result Value Ref Range   Glucose, Bld 106 (H) 65 - 99 mg/dL    Comment: .            Fasting reference interval . For someone without known diabetes, a glucose value between 100 and 125 mg/dL is consistent with prediabetes and should be confirmed with a follow-up test. .    BUN 17 7 - 25 mg/dL   Creat 1.02 (H) 0.50 - 0.99 mg/dL    Comment: For patients >29 years of age, the reference limit for Creatinine is approximately 13% higher for people identified as African-American. .    GFR, Est Non African American 57 (L) > OR = 60 mL/min/1.60m   GFR, Est African American 66 > OR = 60 mL/min/1.723m  BUN/Creatinine Ratio 17 6 - 22 (calc)   Sodium 140 135 - 146 mmol/L   Potassium 4.0 3.5 - 5.3 mmol/L   Chloride 102 98 - 110 mmol/L   CO2 28 20 - 32 mmol/L   Calcium 9.9 8.6 - 10.4 mg/dL   Total Protein 6.8 6.1 - 8.1 g/dL   Albumin 4.4 3.6 - 5.1 g/dL   Globulin 2.4 1.9 - 3.7 g/dL (calc)   AG Ratio 1.8 1.0 - 2.5 (calc)   Total Bilirubin 0.4 0.2 - 1.2 mg/dL   Alkaline phosphatase (APISO) 70 37 - 153 U/L   AST 15 10 - 35 U/L   ALT 16 6 - 29 U/L  Hemoglobin A1c     Status: Abnormal   Collection Time: 12/09/18 11:59 AM  Result Value Ref Range   Hgb A1c MFr Bld 6.1 (H) <5.7 % of total Hgb    Comment: For someone without known diabetes, a hemoglobin  A1c value between 5.7% and 6.4% is consistent with prediabetes and should be confirmed with  a  follow-up test. . For someone with known diabetes, a value <7% indicates that their diabetes is well controlled. A1c targets should be individualized based on duration of diabetes, age, comorbid conditions, and other considerations. . This assay result is consistent with an increased risk of diabetes. . Currently, no consensus exists regarding use of hemoglobin A1c for diagnosis of diabetes for children. .    Mean Plasma Glucose 128 (calc)   eAG (mmol/L) 7.1 (calc)  VITAMIN D 25 Hydroxy (Vit-D Deficiency, Fractures)     Status: None   Collection Time: 12/09/18 11:59 AM  Result Value Ref Range   Vit D, 25-Hydroxy 36 30 - 100 ng/mL    Comment: Vitamin D Status         25-OH Vitamin D: . Deficiency:                    <  20 ng/mL Insufficiency:             20 - 29 ng/mL Optimal:                 > or = 30 ng/mL . For 25-OH Vitamin D testing on patients on  D2-supplementation and patients for whom quantitation  of D2 and D3 fractions is required, the [facility]ure[facility](TM) 25-OH VIT D, (D2,D3), LC/MS/MS is recommended: order  code [phone] (patients >106yr). See Note 1 . Note 1 . For additional information, please refer to  http://education.[facility].com/faq/FAQ199  (This link is being provided for informational/ educational purposes only.)   CBC     Status: Abnormal   Collection Time: 12/09/18 11:59 AM  Result Value Ref Range   WBC 3.1 (L) 3.8 - 10.8 Thousand/uL   RBC 4.45 3.80 - 5.10 Million/uL   Hemoglobin 12.1 11.7 - 15.5 g/dL   HCT 36.8 35.0 - 45.0 %   MCV 82.7 80.0 - 100.0 fL   MCH 27.2 27.0 - 33.0 pg   MCHC 32.9 32.0 - 36.0 g/dL   RDW 14.7 11.0 - 15.0 %   Platelets 320 140 - 400 Thousand/uL   MPV 9.7 7.5 - 12.5 fL  Insulin, random     Status: None   Collection Time: 12/09/18 11:59 AM  Result Value Ref Range   Insulin 12.7 uIU/mL    Comment:      Reference Range  < or = 19.6 .      Risk:      Optimal          < or = 19.6      Moderate         NA       High             >19.6 .      Adult cardiovascular event risk category      cut points (optimal, moderate, high)      are based on [employer] Productspopulation      data from 04/2010. . This insulin assay shows strong cross-reactivity for some insulin analogs (lispro, aspart, and glargine) and much lower cross-reactivity with others (detemir, glulisine).       PHQ2/9: Depression screen [facility] 12/19/2018 12/19/2018 06/07/2017 08/26/2016 05/26/2016  Decreased Interest 0 0 0 0 0  Down, Depressed, Hopeless 0 0 0 0 0  PHQ - 2 Score 0 0 0 0 0  Altered sleeping 0 - - - -  Tired, decreased energy 0 0 - - -  Change in appetite 0 0 - - -  Feeling bad or failure about yourself  0 0 - - -  Trouble concentrating 0 0 - - -  Moving slowly or fidgety/restless 0 0 - - -  Suicidal thoughts 0 0 - - -  PHQ-9 Score 0 - - - -     Fall Risk: Fall Risk  12/19/2018 06/07/2017 08/26/2016 05/26/2016 04/25/2015  Falls in the past year? 0 No No No No  Number falls in past yr: 0 - - - -  Injury with Fall? 0 - - - -     Functional Status Survey: Is the patient deaf or have difficulty hearing?: No Does the patient have difficulty seeing, even when wearing glasses/contacts?: No Does the patient have difficulty concentrating, remembering, or making decisions?: No Does the patient have difficulty walking or climbing stairs?: No Does the patient have difficulty dressing or bathing?: No Does the patient have difficulty doing errands alone  such as visiting a doctor's office or shopping?: No   Assessment & Plan  1. Other neutropenia ([place])  Recheck next visit   2. Need for vaccination for pneumococcus  - Pneumococcal conjugate vaccine 13-valent IM  3. Well adult exam    4. Essential hypertension  - losartan-hydrochlorothiazide (HYZAAR) 50-12.5 MG tablet; Take 1 tablet by mouth daily.  Dispense: 90 tablet; Refill: 0  5. Dyslipidemia  Discussed therapy, she will change her diet and we will recheck  next visit   6. Vitamin D deficiency  Continue otc supplementation   7. Hyperglycemia  Reviewed labs and diet  8. Pre-diabetes   9. Ovarian failure  - DG Bone Density; Future  10. Encounter for screening mammogram for breast cancer  - MM Digital Screening; Future  -USPSTF grade A and B recommendations reviewed with patient; age-appropriate recommendations, preventive care, screening tests, etc discussed and encouraged; healthy living encouraged; see AVS for patient education given to patient -Discussed importance of 150 minutes of physical activity weekly, eat two servings of fish weekly, eat one serving of tree nuts ( cashews, pistachios, pecans, almonds.Marland Kitchen) every other day, eat 6 servings of fruit/vegetables daily and drink plenty of water and avoid sweet beverages.

## 2018-12-19 NOTE — Patient Instructions (Signed)
Preventive Care 66 Years and Older, Female Preventive care refers to lifestyle choices and visits with your health care provider that can promote health and wellness. This includes:  A yearly physical exam. This is also called an annual well check.  Regular dental and eye exams.  Immunizations.  Screening for certain conditions.  Healthy lifestyle choices, such as diet and exercise. What can I expect for my preventive care visit? Physical exam Your health care provider will check:  Height and weight. These may be used to calculate body mass index (BMI), which is a measurement that tells if you are at a healthy weight.  Heart rate and blood pressure.  Your skin for abnormal spots. Counseling Your health care provider may ask you questions about:  Alcohol, tobacco, and drug use.  Emotional well-being.  Home and relationship well-being.  Sexual activity.  Eating habits.  History of falls.  Memory and ability to understand (cognition).  Work and work Statistician.  Pregnancy and menstrual history. What immunizations do I need?  Influenza (flu) vaccine  This is recommended every year. Tetanus, diphtheria, and pertussis (Tdap) vaccine  You may need a Td booster every 10 years. Varicella (chickenpox) vaccine  You may need this vaccine if you have not already been vaccinated. Zoster (shingles) vaccine  You may need this after age 66. Pneumococcal conjugate (PCV13) vaccine  One dose is recommended after age 66. Pneumococcal polysaccharide (PPSV23) vaccine  One dose is recommended after age 72. Measles, mumps, and rubella (MMR) vaccine  You may need at least one dose of MMR if you were born in 1957 or later. You may also need a second dose. Meningococcal conjugate (MenACWY) vaccine  You may need this if you have certain conditions. Hepatitis A vaccine  You may need this if you have certain conditions or if you travel or work in places where you may be exposed  to hepatitis A. Hepatitis B vaccine  You may need this if you have certain conditions or if you travel or work in places where you may be exposed to hepatitis B. Haemophilus influenzae type b (Hib) vaccine  You may need this if you have certain conditions. You may receive vaccines as individual doses or as more than one vaccine together in one shot (combination vaccines). Talk with your health care provider about the risks and benefits of combination vaccines. What tests do I need? Blood tests  Lipid and cholesterol levels. These may be checked every 5 years, or more frequently depending on your overall health.  Hepatitis C test.  Hepatitis B test. Screening  Lung cancer screening. You may have this screening every year starting at age 66 if you have a 30-pack-year history of smoking and currently smoke or have quit within the past 15 years.  Colorectal cancer screening. All adults should have this screening starting at age 66 and continuing until age 15. Your health care provider may recommend screening at age 66 if you are at increased risk. You will have tests every 1-10 years, depending on your results and the type of screening test.  Diabetes screening. This is done by checking your blood sugar (glucose) after you have not eaten for a while (fasting). You may have this done every 1-3 years.  Mammogram. This may be done every 1-2 years. Talk with your health care provider about how often you should have regular mammograms.  BRCA-related cancer screening. This may be done if you have a family history of breast, ovarian, tubal, or peritoneal cancers.  Other tests  Sexually transmitted disease (STD) testing.  Bone density scan. This is done to screen for osteoporosis. You may have this done starting at age 66. Follow these instructions at home: Eating and drinking  Eat a diet that includes fresh fruits and vegetables, whole grains, lean protein, and low-fat dairy products. Limit  your intake of foods with high amounts of sugar, saturated fats, and salt.  Take vitamin and mineral supplements as recommended by your health care provider.  Do not drink alcohol if your health care provider tells you not to drink.  If you drink alcohol: ? Limit how much you have to 0-1 drink a day. ? Be aware of how much alcohol is in your drink. In the [place] one drink equals one 12 oz bottle of beer (355 mL), one 5 oz glass of wine (148 mL), or one 1 oz glass of hard liquor (44 mL). Lifestyle  Take daily care of your teeth and gums.  Stay active. Exercise for at least 30 minutes on 5 or more days each week.  Do not use any products that contain nicotine or tobacco, such as cigarettes, e-cigarettes, and chewing tobacco. If you need help quitting, ask your health care provider.  If you are sexually active, practice safe sex. Use a condom or other form of protection in order to prevent STIs (sexually transmitted infections).  Talk with your health care provider about taking a low-dose aspirin or statin. What's next?  Go to your health care provider once a year for a well check visit.  Ask your health care provider how often you should have your eyes and teeth checked.  Stay up to date on all vaccines. This information is not intended to replace advice given to you by your health care provider. Make sure you discuss any questions you have with your health care provider. Document Released: 05/24/2015 Document Revised: 04/21/2018 Document Reviewed: 04/21/2018 Elsevier Patient Education  2020 [employer].

## 2019-03-28 ENCOUNTER — Other Ambulatory Visit: Payer: Self-pay

## 2019-03-28 ENCOUNTER — Ambulatory Visit (INDEPENDENT_AMBULATORY_CARE_PROVIDER_SITE_OTHER): Payer: PPO

## 2019-03-28 ENCOUNTER — Ambulatory Visit: Payer: PPO | Admitting: Family Medicine

## 2019-03-28 ENCOUNTER — Ambulatory Visit
Admission: RE | Admit: 2019-03-28 | Discharge: 2019-03-28 | Disposition: A | Discharge status: Home / Self Care | Payer: PPO | Source: Ambulatory Visit | Attending: Family Medicine | Admitting: Family Medicine

## 2019-03-28 VITALS — BP 140/82 | HR 87 | Temp 97.1°F | Resp 16 | Ht 61.0 in | Wt 187.6 lb

## 2019-03-28 DIAGNOSIS — Z23 Encounter for immunization: Secondary | ICD-10-CM | POA: Diagnosis not present

## 2019-03-28 DIAGNOSIS — Z1239 Encounter for other screening for malignant neoplasm of breast: Secondary | ICD-10-CM

## 2019-03-28 DIAGNOSIS — Z1231 Encounter for screening mammogram for malignant neoplasm of breast: Secondary | ICD-10-CM | POA: Diagnosis not present

## 2019-03-28 DIAGNOSIS — Z0000 Encounter for general adult medical examination without abnormal findings: Secondary | ICD-10-CM | POA: Diagnosis not present

## 2019-03-28 DIAGNOSIS — Z13820 Encounter for screening for osteoporosis: Secondary | ICD-10-CM | POA: Insufficient documentation

## 2019-03-28 DIAGNOSIS — E2839 Other primary ovarian failure: Secondary | ICD-10-CM

## 2019-03-28 DIAGNOSIS — R29890 Loss of height: Secondary | ICD-10-CM | POA: Diagnosis not present

## 2019-03-28 DIAGNOSIS — Z780 Asymptomatic menopausal state: Secondary | ICD-10-CM | POA: Diagnosis not present

## 2019-03-28 NOTE — Patient Instructions (Signed)
Madeline Pitts , Thank you for taking time to come for your Medicare Wellness Visit. I appreciate your ongoing commitment to your health goals. Please review the following plan we discussed and let me know if I can assist you in the future.   Screening recommendations/referrals: Colonoscopy: done 10/06/17. Repeat in 2029. Mammogram: scheduled for today Bone Density: scheduled for today Recommended yearly ophthalmology/optometry visit for glaucoma screening and checkup Recommended yearly dental visit for hygiene and checkup  Vaccinations: Influenza vaccine: done today Pneumococcal vaccine: done 12/19/18 Tdap vaccine: done 03/14/12 Shingles vaccine: Shingrix discussed. Please contact your pharmacy for coverage information.   Advanced directives: Advance directive discussed with you today. I have provided a copy for you to complete at home and have notarized. Once this is complete please bring a copy in to our office so we can scan it into your chart.  Conditions/risks identified: Recommend healthy eating and physical activity for desired weight loss.   [email or web address]  Next appointment: Please follow up in one year for your Medicare Annual Wellness visit.     Preventive Care 11 Years and Older, Female Preventive care refers to lifestyle choices and visits with your health care provider that can promote health and wellness. What does preventive care include?  A yearly physical exam. This is also called an annual well check.  Dental exams once or twice a year.  Routine eye exams. Ask your health care provider how often you should have your eyes checked.  Personal lifestyle choices, including:  Daily care of your teeth and gums.  Regular physical activity.  Eating a healthy diet.  Avoiding tobacco and drug use.  Limiting alcohol use.  Practicing safe sex.  Taking low-dose aspirin every day.  Taking vitamin and mineral supplements as recommended  by your health care provider. What happens during an annual well check? The services and screenings done by your health care provider during your annual well check will depend on your age, overall health, lifestyle risk factors, and family history of disease. Counseling  Your health care provider may ask you questions about your:  Alcohol use.  Tobacco use.  Drug use.  Emotional well-being.  Home and relationship well-being.  Sexual activity.  Eating habits.  History of falls.  Memory and ability to understand (cognition).  Work and work Statistician.  Reproductive health. Screening  You may have the following tests or measurements:  Height, weight, and BMI.  Blood pressure.  Lipid and cholesterol levels. These may be checked every 5 years, or more frequently if you are over 87 years old.  Skin check.  Lung cancer screening. You may have this screening every year starting at age 4 if you have a 30-pack-year history of smoking and currently smoke or have quit within the past 15 years.  Fecal occult blood test (FOBT) of the stool. You may have this test every year starting at age 40.  Flexible sigmoidoscopy or colonoscopy. You may have a sigmoidoscopy every 5 years or a colonoscopy every 10 years starting at age 61.  Hepatitis C blood test.  Hepatitis B blood test.  Sexually transmitted disease (STD) testing.  Diabetes screening. This is done by checking your blood sugar (glucose) after you have not eaten for a while (fasting). You may have this done every 1-3 years.  Bone density scan. This is done to screen for osteoporosis. You may have this done starting at age 34.  Mammogram. This may be done every 1-2 years. Talk to your health care provider  about how often you should have regular mammograms. Talk with your health care provider about your test results, treatment options, and if necessary, the need for more tests. Vaccines  Your health care provider may  recommend certain vaccines, such as:  Influenza vaccine. This is recommended every year.  Tetanus, diphtheria, and acellular pertussis (Tdap, Td) vaccine. You may need a Td booster every 10 years.  Zoster vaccine. You may need this after age 1.  Pneumococcal 13-valent conjugate (PCV13) vaccine. One dose is recommended after age 69.  Pneumococcal polysaccharide (PPSV23) vaccine. One dose is recommended after age 50. Talk to your health care provider about which screenings and vaccines you need and how often you need them. This information is not intended to replace advice given to you by your health care provider. Make sure you discuss any questions you have with your health care provider. Document Released: 05/24/2015 Document Revised: 01/15/2016 Document Reviewed: 02/26/2015 Elsevier Interactive Patient Education  2017 [place] Prevention in the Home Falls can cause injuries. They can happen to people of all ages. There are many things you can do to make your home safe and to help prevent falls. What can I do on the outside of my home?  Regularly fix the edges of walkways and driveways and fix any cracks.  Remove anything that might make you trip as you walk through a door, such as a raised step or threshold.  Trim any bushes or trees on the path to your home.  Use bright outdoor lighting.  Clear any walking paths of anything that might make someone trip, such as rocks or tools.  Regularly check to see if handrails are loose or broken. Make sure that both sides of any steps have handrails.  Any raised decks and porches should have guardrails on the edges.  Have any leaves, snow, or ice cleared regularly.  Use sand or salt on walking paths during winter.  Clean up any spills in your garage right away. This includes oil or grease spills. What can I do in the bathroom?  Use night lights.  Install grab bars by the toilet and in the tub and shower. Do not use towel  bars as grab bars.  Use non-skid mats or decals in the tub or shower.  If you need to sit down in the shower, use a plastic, non-slip stool.  Keep the floor dry. Clean up any water that spills on the floor as soon as it happens.  Remove soap buildup in the tub or shower regularly.  Attach bath mats securely with double-sided non-slip rug tape.  Do not have throw rugs and other things on the floor that can make you trip. What can I do in the bedroom?  Use night lights.  Make sure that you have a light by your bed that is easy to reach.  Do not use any sheets or blankets that are too big for your bed. They should not hang down onto the floor.  Have a firm chair that has side arms. You can use this for support while you get dressed.  Do not have throw rugs and other things on the floor that can make you trip. What can I do in the kitchen?  Clean up any spills right away.  Avoid walking on wet floors.  Keep items that you use a lot in easy-to-reach places.  If you need to reach something above you, use a strong step stool that has a grab bar.  Keep electrical cords out of the way.  Do not use floor polish or wax that makes floors slippery. If you must use wax, use non-skid floor wax.  Do not have throw rugs and other things on the floor that can make you trip. What can I do with my stairs?  Do not leave any items on the stairs.  Make sure that there are handrails on both sides of the stairs and use them. Fix handrails that are broken or loose. Make sure that handrails are as long as the stairways.  Check any carpeting to make sure that it is firmly attached to the stairs. Fix any carpet that is loose or worn.  Avoid having throw rugs at the top or bottom of the stairs. If you do have throw rugs, attach them to the floor with carpet tape.  Make sure that you have a light switch at the top of the stairs and the bottom of the stairs. If you do not have them, ask someone to  add them for you. What else can I do to help prevent falls?  Wear shoes that:  Do not have high heels.  Have rubber bottoms.  Are comfortable and fit you well.  Are closed at the toe. Do not wear sandals.  If you use a stepladder:  Make sure that it is fully opened. Do not climb a closed stepladder.  Make sure that both sides of the stepladder are locked into place.  Ask someone to hold it for you, if possible.  Clearly mark and make sure that you can see:  Any grab bars or handrails.  First and last steps.  Where the edge of each step is.  Use tools that help you move around (mobility aids) if they are needed. These include:  Canes.  Walkers.  Scooters.  Crutches.  Turn on the lights when you go into a dark area. Replace any light bulbs as soon as they burn out.  Set up your furniture so you have a clear path. Avoid moving your furniture around.  If any of your floors are uneven, fix them.  If there are any pets around you, be aware of where they are.  Review your medicines with your doctor. Some medicines can make you feel dizzy. This can increase your chance of falling. Ask your doctor what other things that you can do to help prevent falls. This information is not intended to replace advice given to you by your health care provider. Make sure you discuss any questions you have with your health care provider. Document Released: 02/21/2009 Document Revised: 10/03/2015 Document Reviewed: 06/01/2014 Elsevier Interactive Patient Education  2017 [employer].

## 2019-03-28 NOTE — Progress Notes (Addendum)
Subjective:   Madeline Pitts is a 68 y.o. female who presents for an Initial Medicare Annual Wellness Visit.  Review of Systems     Cardiac Risk Factors include: advanced age (>26men, >5 women);hypertension;dyslipidemia;obesity (BMI >30kg/m2)     Objective:    Today's Vitals   03/28/19 1133  BP: 140/82  Pulse: 87  Resp: 16  Temp: (!) 97.1 F (36.2 C)  TempSrc: Temporal  SpO2: 96%  Weight: 187 lb 9.6 oz (85.1 kg)  Height: 5\' 1"  (1.549 m)   Body mass index is 35.45 kg/m.  Advanced Directives 03/28/2019 10/06/2017 08/26/2016 05/26/2016 04/25/2015  Does Patient Have a Medical Advance Directive? No No No No No  Would patient like information on creating a medical advance directive? Yes (MAU/Ambulatory/Procedural Areas - Information given) No - Patient declined - - No - patient declined information    Current Medications (verified) Outpatient Encounter Medications as of 03/28/2019  Medication Sig  . aspirin EC 81 MG tablet Take 1 tablet (81 mg total) by mouth daily.  Marland Kitchen estrogen, conjugated,-medroxyprogesterone (PREMPRO) 0.45-1.5 MG tablet Take by mouth.  Marland Kitchen GLUCOSAMINE SULFATE-MSM PO Take 2 tablets by mouth once.  Marland Kitchen losartan-hydrochlorothiazide (HYZAAR) 50-12.5 MG tablet Take 1 tablet by mouth daily.  . Multiple Vitamin (MULTI-VITAMINS) TABS Take by mouth.  . Omega-3 Fatty Acids (FISH OIL) 1000 MG CAPS Take 1 capsule by mouth 3 (three) times a week.  . TURMERIC PO Take by mouth.  . zinc gluconate 50 MG tablet Take 50 mg by mouth 2 (two) times a week.  . [DISCONTINUED] cholecalciferol (VITAMIN D) 1000 units tablet Take 1,000 Units by mouth 2 (two) times a week.   No facility-administered encounter medications on file as of 03/28/2019.     Allergies (verified) Codeine and Penicillins   History: Past Medical History:  Diagnosis Date  . Hypertension   . Obesity   . Prediabetes   . Vitamin D deficiency    Past Surgical History:  Procedure Laterality Date  .  COLONOSCOPY  2009?   Dr Alveta Heimlich  . COLONOSCOPY WITH PROPOFOL N/A 10/06/2017   Procedure: COLONOSCOPY WITH PROPOFOL;  Surgeon: Robert Bellow, MD;  Location: [facility] ENDOSCOPY;  Service: Endoscopy;  Laterality: N/A;  . ENDOMETRIAL ABLATION     Family History  Problem Relation Age of Onset  . Uterine cancer Mother   . Gout Father   . COPD Father   . Emphysema Father        Was a heavy smoker  . Lung cancer Brother        smoker  . Lung cancer Brother   . Breast cancer Neg Hx    Social History   Socioeconomic History  . Marital status: Married    Spouse name: Madeline Pitts   . Number of children: 2  . Years of education: Not on file  . Highest education level: Bachelor's degree (e.g., BA, AB, BS)  Occupational History  . 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 Financial resource strain: Not hard at all  . Food insecurity    Worry: Never true    Inability: Never true  . Transportation needs    Medical: No    Non-medical: No  Tobacco Use  . Smoking status: Never Smoker  . Smokeless tobacco: Never Used  Substance and Sexual Activity  . Alcohol use: No    Alcohol/week: 0.0 standard drinks  . Drug use: No  . Sexual activity: Yes    Partners: Male  Birth control/protection: Post-menopausal  Lifestyle  . Physical activity    Days per week: 5 days    Minutes per session: 50 min  . Stress: Not at all  Relationships  . Social connections    Talks on phone: More than three times a week    Gets together: More than three times a week    Attends religious service: More than 4 times per year    Active member of club or organization: Yes    Attends meetings of clubs or organizations: More than 4 times per year    Relationship status: Married  Other Topics Concern  . Not on file  Social History Narrative  . Not on file    Tobacco Counseling Counseling given: Not Answered   Clinical Intake:  Pre-visit preparation completed: Yes  Pain : No/denies  pain     BMI - recorded: 35.45 Nutritional Status: BMI > 30  Obese Nutritional Risks: None Diabetes: No  How often do you need to have someone help you when you read instructions, pamphlets, or other written materials from your doctor or pharmacy?: 1 - Never  Interpreter Needed?: No  Information entered by :: Clemetine Marker LPN   Activities of Daily Living In your present state of health, do you have any difficulty performing the following activities: 03/28/2019 12/19/2018  Hearing? N N  Comment declines hearing aids -  Vision? N N  Difficulty concentrating or making decisions? N N  Walking or climbing stairs? N N  Dressing or bathing? N N  Doing errands, shopping? N N  Preparing Food and eating ? N -  Using the Toilet? N -  In the past six months, have you accidently leaked urine? N -  Do you have problems with loss of bowel control? N -  Managing your Medications? N -  Managing your Finances? N -  Housekeeping or managing your Housekeeping? N -  Some recent data might be hidden     Immunizations and Health Maintenance Immunization History  Administered Date(s) Administered  . Fluad Quad(high Dose 65+) 03/28/2019  . Pneumococcal Conjugate-13 12/19/2018  . Tdap 03/14/2012   Health Maintenance Due  Topic Date Due  . INFLUENZA VACCINE  12/10/2018    Patient Care Team: Steele Sizer, MD as PCP - General (Family Medicine) Wyatt Haste, MD as Referring Physician (Obstetrics and Gynecology)  Indicate any recent Medical Services you may have received from other than [facility] providers in the past year (date may be approximate).     Assessment:   This is a routine wellness examination for Madeline Pitts.  Hearing/Vision screen  Hearing Screening   125Hz  250Hz  500Hz  1000Hz  2000Hz  3000Hz  4000Hz  6000Hz  8000Hz   Right ear:           Left ear:           Comments: Pt denies hearing difficulty  Vision Screening Comments: Annual vision screenings done at eye provider in [place] issues and exercise activities discussed: Current Exercise Habits: Home exercise routine, Type of exercise: walking;calisthenics, Time (Minutes): 50, Frequency (Times/Week): 5, Weekly Exercise (Minutes/Week): 250, Intensity: Moderate, Exercise limited by: None identified  Goals    . Weight (lb) < 175 lb (79.4 kg)     Pt would like to lose weight over the next year with diet and exercise.       Depression Screen PHQ 2/9 Scores 03/28/2019 12/19/2018 12/19/2018 06/07/2017 08/26/2016 05/26/2016 04/25/2015  PHQ - 2 Score 0 0 0 0 0 0 0  PHQ-  9 Score - 0 - - - - -    Fall Risk Fall Risk  03/28/2019 12/19/2018 06/07/2017 08/26/2016 05/26/2016  Falls in the past year? 0 0 No No No  Number falls in past yr: 0 0 - - -  Injury with Fall? 0 0 - - -  Follow up Falls prevention discussed - - - -    FALL RISK PREVENTION PERTAINING TO THE HOME:  Any stairs in or around the home? Yes  If so, do they handrails? Yes   Home free of loose throw rugs in walkways, pet beds, electrical cords, etc? Yes  Adequate lighting in your home to reduce risk of falls? Yes   ASSISTIVE DEVICES UTILIZED TO PREVENT FALLS:  Life alert? No  Use of a cane, walker or w/c? No  Grab bars in the bathroom? Yes  Shower chair or bench in shower? No  Elevated toilet seat or a handicapped toilet? No   DME ORDERS:  DME order needed?  No   TIMED UP AND GO:  Was the test performed? Yes .  Length of time to ambulate 10 feet: 5 sec.   GAIT:  Appearance of gait: Gait stead-fast and without the use of an assistive device.   Education: Fall risk prevention has been discussed.  Intervention(s) required? No   Cognitive Function: Pt declined 6CIT for 2020 AWV. States she has no memory issues.         Screening Tests Health Maintenance  Topic Date Due  . INFLUENZA VACCINE  12/10/2018  . MAMMOGRAM  03/30/2019  . PNA vac Low Risk Adult (2 of 2 - PPSV23) 12/19/2019  . TETANUS/TDAP  03/14/2022  . COLONOSCOPY   10/07/2027  . DEXA SCAN  Completed  . Hepatitis C Screening  Completed    Qualifies for Shingles Vaccine? Yes  . Due for Shingrix. Education has been provided regarding the importance of this vaccine. Pt has been advised to call insurance company to determine out of pocket expense. Advised may also receive vaccine at local pharmacy or Health Dept. Verbalized acceptance and understanding.  Tdap: Up to date  Flu Vaccine: Due for Flu vaccine. Does the patient want to receive this vaccine today?  Yes . Education has been provided regarding the importance of this vaccine but still declined. Advised may receive this vaccine at local pharmacy or Health Dept. Aware to provide a copy of the vaccination record if obtained from local pharmacy or Health Dept. Verbalized acceptance and understanding.  Pneumococcal Vaccine: Up to date   Cancer Screenings:  Colorectal Screening: Completed 10/06/17. Repeat every 10 years;   Mammogram: Completed 03/30/15. Repeat every year; scheduled for 03/28/19   Bone Density: Completed 04/18/2014. Results reflect NORMAL. Scheduled for 03/28/19.  Lung Cancer Screening: (Low Dose CT Chest recommended if Age 5-80 years, 30 pack-year currently smoking OR have quit w/in 15years.) does not qualify.   Additional Screening:  Hepatitis C Screening: does qualify; Completed 08/21/16  Vision Screening: Recommended annual ophthalmology exams for early detection of glaucoma and other disorders of the eye. Is the patient up to date with their annual eye exam?  Yes  Who is the provider or what is the name of the office in which the pt attends annual eye exams? Eye provider in [place], does not recall the name  Dental Screening: Recommended annual dental exams for proper oral hygiene  Community Resource Referral:  CRR required this visit?  No      Plan:    I have personally  reviewed and addressed the Medicare Annual Wellness questionnaire and have noted the following in  the patient's chart:  A. Medical and social history B. Use of alcohol, tobacco or illicit drugs  C. Current medications and supplements D. Functional ability and status E.  Nutritional status F.  Physical activity G. Advance directives H. List of other physicians I.  Hospitalizations, surgeries, and ER visits in previous 12 months J.  [place] such as hearing and vision if needed, cognitive and depression L. Referrals and appointments   In addition, I have reviewed and discussed with patient certain preventive protocols, quality metrics, and best practice recommendations. A written personalized care plan for preventive services as well as general preventive health recommendations were provided to patient.   Signed,  Clemetine Marker, LPN Nurse Health Advisor   Nurse Notes: pt doing well and appreciative of visit today.

## 2019-05-30 ENCOUNTER — Other Ambulatory Visit: Payer: Self-pay | Admitting: Family Medicine

## 2019-05-30 DIAGNOSIS — I10 Essential (primary) hypertension: Secondary | ICD-10-CM

## 2019-06-21 ENCOUNTER — Other Ambulatory Visit: Payer: Self-pay | Admitting: Family Medicine

## 2019-06-21 ENCOUNTER — Ambulatory Visit: Payer: PPO | Admitting: Family Medicine

## 2019-06-21 DIAGNOSIS — I10 Essential (primary) hypertension: Secondary | ICD-10-CM

## 2019-06-21 NOTE — Telephone Encounter (Signed)
Requested medication (s) are due for refill today - yes  Requested medication (s) are on the active medication list -yes  Future visit scheduled -yes  Last refill: 05/30/19  Notes to clinic: Patient passes protocol with visit- but labs are overdue. Sent for review   Requested Prescriptions  Pending Prescriptions Disp Refills   losartan-hydrochlorothiazide (HYZAAR) 50-12.5 MG tablet [Pharmacy Med Name: LOSARTAN-HCTZ 50-12.5 MG TAB] 30 tablet 2    Sig: TAKE 1 TABLET BY MOUTH EVERY DAY      Cardiovascular: ARB + Diuretic Combos Failed - 06/21/2019  8:30 AM      Failed - K in normal range and within 180 days    Potassium  Date Value Ref Range Status  12/09/2018 4.0 3.5 - 5.3 mmol/L Final          Failed - Na in normal range and within 180 days    Sodium  Date Value Ref Range Status  12/09/2018 140 135 - 146 mmol/L Final  08/21/2016 143 137 - 147 mmol/L Final          Failed - Cr in normal range and within 180 days    Creat  Date Value Ref Range Status  12/09/2018 1.02 (H) 0.50 - 0.99 mg/dL Final    Comment:    For patients >50 years of age, the reference limit for Creatinine is approximately 13% higher for people identified as African-American. .           Failed - Ca in normal range and within 180 days    Calcium  Date Value Ref Range Status  12/09/2018 9.9 8.6 - 10.4 mg/dL Final          Failed - Last BP in normal range    BP Readings from Last 1 Encounters:  03/28/19 140/82          Passed - Patient is not pregnant      Passed - Valid encounter within last 6 months    Recent Outpatient Visits           6 months ago Other neutropenia [facility])   [facility] Steele Sizer, MD   2 years ago Essential hypertension   [facility] Steele Sizer, MD   2 years ago Well woman exam   [facility] [place], Drue Stager, MD   3 years ago Essential hypertension   [facility] [place], Drue Stager,  MD   4 years ago Annual physical exam   [facility] Ashok Norris, MD       Future Appointments             In 6 months Steele Sizer, MD [facility], [facility]                Requested Prescriptions  Pending Prescriptions Disp Refills   losartan-hydrochlorothiazide (HYZAAR) 50-12.5 MG tablet [Pharmacy Med Name: LOSARTAN-HCTZ 50-12.5 MG TAB] 30 tablet 2    Sig: TAKE 1 TABLET BY MOUTH EVERY DAY      Cardiovascular: ARB + Diuretic Combos Failed - 06/21/2019  8:30 AM      Failed - K in normal range and within 180 days    Potassium  Date Value Ref Range Status  12/09/2018 4.0 3.5 - 5.3 mmol/L Final          Failed - Na in normal range and within 180 days    Sodium  Date Value Ref Range Status  12/09/2018 140 135 - 146  mmol/L Final  08/21/2016 143 137 - 147 mmol/L Final          Failed - Cr in normal range and within 180 days    Creat  Date Value Ref Range Status  12/09/2018 1.02 (H) 0.50 - 0.99 mg/dL Final    Comment:    For patients >73 years of age, the reference limit for Creatinine is approximately 13% higher for people identified as African-American. .           Failed - Ca in normal range and within 180 days    Calcium  Date Value Ref Range Status  12/09/2018 9.9 8.6 - 10.4 mg/dL Final          Failed - Last BP in normal range    BP Readings from Last 1 Encounters:  03/28/19 140/82          Passed - Patient is not pregnant      Passed - Valid encounter within last 6 months    Recent Outpatient Visits           6 months ago Other neutropenia [facility])   [facility] Steele Sizer, MD   2 years ago Essential hypertension   [facility] Steele Sizer, MD   2 years ago Well woman exam   [facility] Steele Sizer, MD   3 years ago Essential hypertension   [facility] Steele Sizer, MD   4 years ago Annual physical exam    [facility] Ashok Norris, MD       Future Appointments             In 6 months Ancil Boozer, Drue Stager, MD [facility], [facility]

## 2019-09-18 ENCOUNTER — Other Ambulatory Visit: Payer: Self-pay | Admitting: Family Medicine

## 2019-09-18 DIAGNOSIS — I10 Essential (primary) hypertension: Secondary | ICD-10-CM

## 2019-10-06 ENCOUNTER — Other Ambulatory Visit: Payer: Self-pay | Admitting: Family Medicine

## 2019-10-06 DIAGNOSIS — I10 Essential (primary) hypertension: Secondary | ICD-10-CM

## 2019-10-06 NOTE — Telephone Encounter (Signed)
Patient requesting losartan-hydrochlorothiazide (HYZAAR) 50-12.5 MG tablet, informed patient please allow 48 to 72 hour turn around time.   [id number] [address], [place] Phone:  [phone]  Fax:  [phone]

## 2019-12-17 ENCOUNTER — Other Ambulatory Visit: Payer: Self-pay | Admitting: Family Medicine

## 2019-12-17 DIAGNOSIS — I10 Essential (primary) hypertension: Secondary | ICD-10-CM

## 2019-12-26 ENCOUNTER — Encounter: Payer: PPO | Admitting: Family Medicine

## 2020-01-11 ENCOUNTER — Other Ambulatory Visit: Payer: Self-pay | Admitting: Family Medicine

## 2020-01-11 DIAGNOSIS — I10 Essential (primary) hypertension: Secondary | ICD-10-CM

## 2020-02-07 ENCOUNTER — Other Ambulatory Visit: Payer: Self-pay | Admitting: Family Medicine

## 2020-02-07 DIAGNOSIS — I10 Essential (primary) hypertension: Secondary | ICD-10-CM

## 2020-02-13 ENCOUNTER — Other Ambulatory Visit: Payer: Self-pay | Admitting: Family Medicine

## 2020-02-13 DIAGNOSIS — Z1231 Encounter for screening mammogram for malignant neoplasm of breast: Secondary | ICD-10-CM

## 2020-02-14 DIAGNOSIS — Z124 Encounter for screening for malignant neoplasm of cervix: Secondary | ICD-10-CM | POA: Diagnosis not present

## 2020-02-14 DIAGNOSIS — I10 Essential (primary) hypertension: Secondary | ICD-10-CM | POA: Diagnosis not present

## 2020-02-14 DIAGNOSIS — D219 Benign neoplasm of connective and other soft tissue, unspecified: Secondary | ICD-10-CM | POA: Diagnosis not present

## 2020-02-14 DIAGNOSIS — Z01419 Encounter for gynecological examination (general) (routine) without abnormal findings: Secondary | ICD-10-CM | POA: Diagnosis not present

## 2020-02-14 DIAGNOSIS — Z79890 Hormone replacement therapy (postmenopausal): Secondary | ICD-10-CM | POA: Diagnosis not present

## 2020-02-14 LAB — HM PAP SMEAR: HM Pap smear: NORMAL

## 2020-04-10 ENCOUNTER — Telehealth: Payer: Self-pay | Admitting: Family Medicine

## 2020-04-10 NOTE — Telephone Encounter (Signed)
Copied from [place] [id number]. Topic: Medicare AWV >> Apr 10, 2020 10:05 AM Cher Nakai R wrote: Reason for CRM:  Left message for patient to call back and schedule Medicare Annual Wellness Visit (AWV) in office.   If not able to come in office, please offer to do virtually.   Last AWV 03/28/2019  Please schedule at anytime with [place].  40 minute appointment  Any questions, please contact me at [phone]

## 2020-04-25 ENCOUNTER — Encounter: Payer: PPO | Admitting: Family Medicine

## 2020-05-02 ENCOUNTER — Other Ambulatory Visit: Payer: Self-pay

## 2020-05-02 ENCOUNTER — Ambulatory Visit (INDEPENDENT_AMBULATORY_CARE_PROVIDER_SITE_OTHER): Payer: PPO

## 2020-05-02 VITALS — BP 132/80 | HR 80 | Temp 98.3°F | Resp 16 | Ht 61.0 in | Wt 192.3 lb

## 2020-05-02 DIAGNOSIS — Z0000 Encounter for general adult medical examination without abnormal findings: Secondary | ICD-10-CM

## 2020-05-02 DIAGNOSIS — Z23 Encounter for immunization: Secondary | ICD-10-CM | POA: Diagnosis not present

## 2020-05-02 NOTE — Patient Instructions (Addendum)
Madeline Pitts , Thank you for taking time to come for your Medicare Wellness Visit. I appreciate your ongoing commitment to your health goals. Please review the following plan we discussed and let me know if I can assist you in the future.   Screening recommendations/referrals: Colonoscopy: done 10/06/17. Repeat in 2024.  Mammogram: done 03/28/19. Please call [phone] to schedule your mammogram.  Bone Density: done 03/28/19 Recommended yearly ophthalmology/optometry visit for glaucoma screening and checkup Recommended yearly dental visit for hygiene and checkup  Vaccinations: Influenza vaccine: done today Pneumococcal vaccine: done 12/19/18; due for Pneumovax23 Tdap vaccine: done 03/14/12 Shingles vaccine: Shingrix discussed. Please contact your pharmacy for coverage information.  Covid-19: done 06/09/19, 07/07/19 & 03/15/20  Advanced directives: Advance directive discussed with you today. Even though you declined this today please call our office should you change your mind and we can give you the proper paperwork for you to fill out.  Conditions/risks identified: Keep up the great work!  Next appointment: Follow up in one year for your annual wellness visit    Preventive Care 65 Years and Older, Female Preventive care refers to lifestyle choices and visits with your health care provider that can promote health and wellness. What does preventive care include?  A yearly physical exam. This is also called an annual well check.  Dental exams once or twice a year.  Routine eye exams. Ask your health care provider how often you should have your eyes checked.  Personal lifestyle choices, including:  Daily care of your teeth and gums.  Regular physical activity.  Eating a healthy diet.  Avoiding tobacco and drug use.  Limiting alcohol use.  Practicing safe sex.  Taking low-dose aspirin every day.  Taking vitamin and mineral supplements as recommended by your health care  provider. What happens during an annual well check? The services and screenings done by your health care provider during your annual well check will depend on your age, overall health, lifestyle risk factors, and family history of disease. Counseling  Your health care provider may ask you questions about your:  Alcohol use.  Tobacco use.  Drug use.  Emotional well-being.  Home and relationship well-being.  Sexual activity.  Eating habits.  History of falls.  Memory and ability to understand (cognition).  Work and work Astronomerenvironment.  Reproductive health. Screening  You may have the following tests or measurements:  Height, weight, and BMI.  Blood pressure.  Lipid and cholesterol levels. These may be checked every 5 years, or more frequently if you are over 67 years old.  Skin check.  Lung cancer screening. You may have this screening every year starting at age 67 if you have a 30-pack-year history of smoking and currently smoke or have quit within the past 15 years.  Fecal occult blood test (FOBT) of the stool. You may have this test every year starting at age 67.  Flexible sigmoidoscopy or colonoscopy. You may have a sigmoidoscopy every 5 years or a colonoscopy every 10 years starting at age 67.  Hepatitis C blood test.  Hepatitis B blood test.  Sexually transmitted disease (STD) testing.  Diabetes screening. This is done by checking your blood sugar (glucose) after you have not eaten for a while (fasting). You may have this done every 1-3 years.  Bone density scan. This is done to screen for osteoporosis. You may have this done starting at age 67.  Mammogram. This may be done every 1-2 years. Talk to your health care provider about how  often you should have regular mammograms. Talk with your health care provider about your test results, treatment options, and if necessary, the need for more tests. Vaccines  Your health care provider may recommend certain  vaccines, such as:  Influenza vaccine. This is recommended every year.  Tetanus, diphtheria, and acellular pertussis (Tdap, Td) vaccine. You may need a Td booster every 10 years.  Zoster vaccine. You may need this after age 64.  Pneumococcal 13-valent conjugate (PCV13) vaccine. One dose is recommended after age 34.  Pneumococcal polysaccharide (PPSV23) vaccine. One dose is recommended after age 54. Talk to your health care provider about which screenings and vaccines you need and how often you need them. This information is not intended to replace advice given to you by your health care provider. Make sure you discuss any questions you have with your health care provider. Document Released: 05/24/2015 Document Revised: 01/15/2016 Document Reviewed: 02/26/2015 Elsevier Interactive Patient Education  2017 [place] Prevention in the Home Falls can cause injuries. They can happen to people of all ages. There are many things you can do to make your home safe and to help prevent falls. What can I do on the outside of my home?  Regularly fix the edges of walkways and driveways and fix any cracks.  Remove anything that might make you trip as you walk through a door, such as a raised step or threshold.  Trim any bushes or trees on the path to your home.  Use bright outdoor lighting.  Clear any walking paths of anything that might make someone trip, such as rocks or tools.  Regularly check to see if handrails are loose or broken. Make sure that both sides of any steps have handrails.  Any raised decks and porches should have guardrails on the edges.  Have any leaves, snow, or ice cleared regularly.  Use sand or salt on walking paths during winter.  Clean up any spills in your garage right away. This includes oil or grease spills. What can I do in the bathroom?  Use night lights.  Install grab bars by the toilet and in the tub and shower. Do not use towel bars as grab  bars.  Use non-skid mats or decals in the tub or shower.  If you need to sit down in the shower, use a plastic, non-slip stool.  Keep the floor dry. Clean up any water that spills on the floor as soon as it happens.  Remove soap buildup in the tub or shower regularly.  Attach bath mats securely with double-sided non-slip rug tape.  Do not have throw rugs and other things on the floor that can make you trip. What can I do in the bedroom?  Use night lights.  Make sure that you have a light by your bed that is easy to reach.  Do not use any sheets or blankets that are too big for your bed. They should not hang down onto the floor.  Have a firm chair that has side arms. You can use this for support while you get dressed.  Do not have throw rugs and other things on the floor that can make you trip. What can I do in the kitchen?  Clean up any spills right away.  Avoid walking on wet floors.  Keep items that you use a lot in easy-to-reach places.  If you need to reach something above you, use a strong step stool that has a grab bar.  Keep electrical  cords out of the way.  Do not use floor polish or wax that makes floors slippery. If you must use wax, use non-skid floor wax.  Do not have throw rugs and other things on the floor that can make you trip. What can I do with my stairs?  Do not leave any items on the stairs.  Make sure that there are handrails on both sides of the stairs and use them. Fix handrails that are broken or loose. Make sure that handrails are as long as the stairways.  Check any carpeting to make sure that it is firmly attached to the stairs. Fix any carpet that is loose or worn.  Avoid having throw rugs at the top or bottom of the stairs. If you do have throw rugs, attach them to the floor with carpet tape.  Make sure that you have a light switch at the top of the stairs and the bottom of the stairs. If you do not have them, ask someone to add them for  you. What else can I do to help prevent falls?  Wear shoes that:  Do not have high heels.  Have rubber bottoms.  Are comfortable and fit you well.  Are closed at the toe. Do not wear sandals.  If you use a stepladder:  Make sure that it is fully opened. Do not climb a closed stepladder.  Make sure that both sides of the stepladder are locked into place.  Ask someone to hold it for you, if possible.  Clearly mark and make sure that you can see:  Any grab bars or handrails.  First and last steps.  Where the edge of each step is.  Use tools that help you move around (mobility aids) if they are needed. These include:  Canes.  Walkers.  Scooters.  Crutches.  Turn on the lights when you go into a dark area. Replace any light bulbs as soon as they burn out.  Set up your furniture so you have a clear path. Avoid moving your furniture around.  If any of your floors are uneven, fix them.  If there are any pets around you, be aware of where they are.  Review your medicines with your doctor. Some medicines can make you feel dizzy. This can increase your chance of falling. Ask your doctor what other things that you can do to help prevent falls. This information is not intended to replace advice given to you by your health care provider. Make sure you discuss any questions you have with your health care provider. Document Released: 02/21/2009 Document Revised: 10/03/2015 Document Reviewed: 06/01/2014 Elsevier Interactive Patient Education  2017 [employer].

## 2020-05-02 NOTE — Progress Notes (Signed)
Subjective:   Madeline Pitts is a 69 y.o. female who presents for Medicare Annual (Subsequent) preventive examination.  Review of Systems     Cardiac Risk Factors include: advanced age (>60men, >75 women);hypertension;dyslipidemia;obesity (BMI >30kg/m2)     Objective:    Today's Vitals   05/02/20 1020  BP: 132/80  Pulse: 80  Resp: 16  Temp: 98.3 F (36.8 C)  TempSrc: Oral  SpO2: 99%  Weight: 192 lb 4.8 oz (87.2 kg)  Height: 5\' 1"  (1.549 m)   Body mass index is 36.33 kg/m.  Advanced Directives 05/02/2020 03/28/2019 10/06/2017 08/26/2016 05/26/2016 04/25/2015  Does Patient Have a Medical Advance Directive? No No No No No No  Would patient like information on creating a medical advance directive? No - Patient declined Yes (MAU/Ambulatory/Procedural Areas - Information given) No - Patient declined - - No - patient declined information    Current Medications (verified) Outpatient Encounter Medications as of 05/02/2020  Medication Sig  . Apoaequorin (PREVAGEN PO) Take by mouth.  Marland Kitchen aspirin EC 81 MG tablet Take 1 tablet (81 mg total) by mouth daily.  Marland Kitchen estrogen, conjugated,-medroxyprogesterone (PREMPRO) 0.45-1.5 MG tablet Take by mouth.  Marland Kitchen GLUCOSAMINE SULFATE-MSM PO Take 2 tablets by mouth once.  Marland Kitchen losartan-hydrochlorothiazide (HYZAAR) 50-12.5 MG tablet TAKE 1 TABLET BY MOUTH EVERY DAY  . Multiple Vitamin (MULTI-VITAMINS) TABS Take by mouth.  . Omega-3 Fatty Acids (FISH OIL) 1000 MG CAPS Take 1 capsule by mouth 3 (three) times a week.  . TURMERIC PO Take by mouth.  . Vitamins-Lipotropics (LIPOFLAVONOID PO) Take by mouth.  . zinc gluconate 50 MG tablet Take 50 mg by mouth 2 (two) times a week.   No facility-administered encounter medications on file as of 05/02/2020.    Allergies (verified) Codeine and Penicillins   History: Past Medical History:  Diagnosis Date  . Hypertension   . Obesity   . Prediabetes   . Vitamin D deficiency    Past Surgical History:   Procedure Laterality Date  . COLONOSCOPY  2009?   Dr Alveta Heimlich  . COLONOSCOPY WITH PROPOFOL N/A 10/06/2017   Procedure: COLONOSCOPY WITH PROPOFOL;  Surgeon: Robert Bellow, MD;  Location: [facility] ENDOSCOPY;  Service: Endoscopy;  Laterality: N/A;  . ENDOMETRIAL ABLATION     Family History  Problem Relation Age of Onset  . Uterine cancer Mother   . Gout Father   . COPD Father   . Emphysema Father        Was a heavy smoker  . Lung cancer Brother        smoker  . Lung cancer Brother   . Breast cancer Neg Hx    Social History   Socioeconomic History  . Marital status: Married    Spouse name: Karle Starch   . Number of children: 2  . Years of education: Not on file  . Highest education level: Bachelor's degree (e.g., BA, AB, BS)  Occupational History  . 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 Smoking status: Never Smoker  . Smokeless tobacco: Never Used  Vaping Use  . Vaping Use: Never used  Substance and Sexual Activity  . Alcohol use: No    Alcohol/week: 0.0 standard drinks  . Drug use: No  . Sexual activity: Yes    Partners: Male    Birth control/protection: Post-menopausal  Other Topics Concern  . Not on file  Social History Narrative  . Not on file   Social Determinants of Health   Financial  Resource Strain: Low Risk   . Difficulty of Paying Living Expenses: Not hard at all  Food Insecurity: No Food Insecurity  . Worried About Charity fundraiser in the Last Year: Never true  . Ran Out of Food in the Last Year: Never true  Transportation Needs: No Transportation Needs  . Lack of Transportation (Medical): No  . Lack of Transportation (Non-Medical): No  Physical Activity: Sufficiently Active  . Days of Exercise per Week: 5 days  . Minutes of Exercise per Session: 40 min  Stress: No Stress Concern Present  . Feeling of Stress : Not at all  Social Connections: Socially Integrated  . Frequency of Communication with Friends and Family: More than  three times a week  . Frequency of Social Gatherings with Friends and Family: More than three times a week  . Attends Religious Services: More than 4 times per year  . Active Member of Clubs or Organizations: Yes  . Attends Archivist Meetings: More than 4 times per year  . Marital Status: Married    Tobacco Counseling Counseling given: Not Answered   Clinical Intake:  Pre-visit preparation completed: Yes  Pain : No/denies pain     BMI - recorded: 36.33 Nutritional Status: BMI > 30  Obese Nutritional Risks: None Diabetes: No  How often do you need to have someone help you when you read instructions, pamphlets, or other written materials from your doctor or pharmacy?: 1 - Never    Interpreter Needed?: No  Information entered by :: Clemetine Marker LPN   Activities of Daily Living In your present state of health, do you have any difficulty performing the following activities: 05/02/2020  Hearing? N  Comment declines hearing aids  Vision? N  Difficulty concentrating or making decisions? N  Walking or climbing stairs? N  Dressing or bathing? N  Doing errands, shopping? N  Preparing Food and eating ? N  Using the Toilet? N  In the past six months, have you accidently leaked urine? N  Do you have problems with loss of bowel control? N  Managing your Medications? N  Managing your Finances? N  Housekeeping or managing your Housekeeping? N  Some recent data might be hidden    Patient Care Team: Steele Sizer, MD as PCP - General (Family Medicine) Wyatt Haste, MD as Referring Physician (Obstetrics and Gynecology)  Indicate any recent Medical Services you may have received from other than [facility] providers in the past year (date may be approximate).     Assessment:   This is a routine wellness examination for Madeline Pitts.  Hearing/Vision screen  Hearing Screening   125Hz  250Hz  500Hz  1000Hz  2000Hz  3000Hz  4000Hz  6000Hz  8000Hz   Right ear:           Left ear:            Comments: Pt denies hearing difficulty  Vision Screening Comments: Annual vision screenings done at [facility] in [place] issues and exercise activities discussed: Current Exercise Habits: Home exercise routine, Type of exercise: walking, Time (Minutes): 40, Frequency (Times/Week): 5, Weekly Exercise (Minutes/Week): 200, Intensity: Moderate, Exercise limited by: None identified  Goals    . Weight (lb) < 175 lb (79.4 kg)     Pt would like to lose weight over the next year with diet and exercise.       Depression Screen PHQ 2/9 Scores 05/02/2020 03/28/2019 12/19/2018 12/19/2018 06/07/2017 08/26/2016 05/26/2016  PHQ - 2 Score 0 0 0 0 0 0 0  PHQ- 9 Score - - 0 - - - -    Fall Risk Fall Risk  05/02/2020 03/28/2019 12/19/2018 06/07/2017 08/26/2016  Falls in the past year? 0 0 0 No No  Number falls in past yr: 0 0 0 - -  Injury with Fall? 0 0 0 - -  Risk for fall due to : No Fall Risks - - - -  Follow up Falls prevention discussed Falls prevention discussed - - -    FALL RISK PREVENTION PERTAINING TO THE HOME:  Any stairs in or around the home? Yes  If so, are there any without handrails? No  Home free of loose throw rugs in walkways, pet beds, electrical cords, etc? Yes  Adequate lighting in your home to reduce risk of falls? Yes   ASSISTIVE DEVICES UTILIZED TO PREVENT FALLS:  Life alert? No  Use of a cane, walker or w/c? No  Grab bars in the bathroom? Yes  Shower chair or bench in shower? No  Elevated toilet seat or a handicapped toilet? No   TIMED UP AND GO:  Was the test performed? Yes .  Length of time to ambulate 10 feet: 4 sec.   Gait steady and fast without use of assistive device  Cognitive Function:     6CIT Screen 05/02/2020  What Year? 0 points  What month? 0 points  What time? 0 points  Count back from 20 0 points  Months in reverse 0 points  Repeat phrase 0 points  Total Score 0    Immunizations Immunization History  Administered Date(s)  Administered  . Fluad Quad(high Dose 65+) 03/28/2019, 05/02/2020  . Moderna Sars-Covid-2 Vaccination 06/09/2019, 07/07/2019, 03/15/2020  . Pneumococcal Conjugate-13 12/19/2018  . Tdap 03/14/2012    TDAP status: Up to date   Flu Vaccine status: Completed at today's visit  Pneumococcal vaccine status: Due, Education has been provided regarding the importance of this vaccine. Advised may receive this vaccine at local pharmacy or Health Dept. Aware to provide a copy of the vaccination record if obtained from local pharmacy or Health Dept. Verbalized acceptance and understanding.  Covid-19 vaccine status: Completed vaccines  Qualifies for Shingles Vaccine? Yes   Zostavax completed No   Shingrix Completed?: No.    Education has been provided regarding the importance of this vaccine. Patient has been advised to call insurance company to determine out of pocket expense if they have not yet received this vaccine. Advised may also receive vaccine at local pharmacy or Health Dept. Verbalized acceptance and understanding.  Screening Tests Health Maintenance  Topic Date Due  . PNA vac Low Risk Adult (2 of 2 - PPSV23) 12/19/2019  . COVID-19 Vaccine (4 - Booster for Moderna series) 09/12/2020  . MAMMOGRAM  03/27/2021  . TETANUS/TDAP  03/14/2022  . COLONOSCOPY  10/07/2022  . INFLUENZA VACCINE  Completed  . DEXA SCAN  Completed  . Hepatitis C Screening  Completed    Health Maintenance  Health Maintenance Due  Topic Date Due  . PNA vac Low Risk Adult (2 of 2 - PPSV23) 12/19/2019    Colorectal Cancer screening: Type of screening: Colonoscopy. Completed 10/06/17. Repeat every 5 years.   Mammogram status: Completed 03/28/19. Repeat every year. Ordered 02/13/20.  Bone Density status: Completed 03/28/19 . Results reflect: Bone density results: NORMAL. Repeat every 2 years.  Lung Cancer Screening: (Low Dose CT Chest recommended if Age 56-80 years, 30 pack-year currently smoking OR have quit w/in  15years.) does not qualify.   Additional Screening:  Hepatitis C Screening: does qualify; Completed 08/21/16  Vision Screening: Recommended annual ophthalmology exams for early detection of glaucoma and other disorders of the eye. Is the patient up to date with their annual eye exam?  Yes  Who is the provider or what is the name of the office in which the patient attends annual eye exams? [facility]  Dental Screening: Recommended annual dental exams for proper oral hygiene  Community Resource Referral / Chronic Care Management: CRR required this visit?  No   CCM required this visit?  No      Plan:     I have personally reviewed and noted the following in the patient's chart:   . Medical and social history . Use of alcohol, tobacco or illicit drugs  . Current medications and supplements . Functional ability and status . Nutritional status . Physical activity . Advanced directives . List of other physicians . Hospitalizations, surgeries, and ER visits in previous 12 months . Vitals . Screenings to include cognitive, depression, and falls . Referrals and appointments  In addition, I have reviewed and discussed with patient certain preventive protocols, quality metrics, and best practice recommendations. A written personalized care plan for preventive services as well as general preventive health recommendations were provided to patient.     Clemetine Marker, LPN   [id number]   Nurse Notes: pt doing well and appreciative of visit today.

## 2020-06-07 ENCOUNTER — Other Ambulatory Visit: Payer: Self-pay | Admitting: Family Medicine

## 2020-06-07 DIAGNOSIS — I10 Essential (primary) hypertension: Secondary | ICD-10-CM

## 2020-06-10 ENCOUNTER — Other Ambulatory Visit: Payer: Self-pay | Admitting: Family Medicine

## 2020-06-10 DIAGNOSIS — I10 Essential (primary) hypertension: Secondary | ICD-10-CM

## 2020-06-11 ENCOUNTER — Other Ambulatory Visit: Payer: Self-pay

## 2020-06-11 NOTE — Telephone Encounter (Signed)
   Notes to clinic:  Product Backordered/Unavailable:COMBO HYZAAR ON BACKORDER PLEASE SEND IN NEW SCRIPTS FOR FOR LOSARTAN AND HCTZ SEPERATELY  Requested Prescriptions  Pending Prescriptions Disp Refills   losartan-hydrochlorothiazide (HYZAAR) 50-12.5 MG tablet [Pharmacy Med Name: LOSARTAN-HCTZ 50-12.5 MG TAB] 90 tablet 0    Sig: TAKE 1 TABLET BY MOUTH EVERY DAY      Cardiovascular: ARB + Diuretic Combos Failed - 06/10/2020 11:04 PM      Failed - K in normal range and within 180 days    Potassium  Date Value Ref Range Status  12/09/2018 4.0 3.5 - 5.3 mmol/L Final          Failed - Na in normal range and within 180 days    Sodium  Date Value Ref Range Status  12/09/2018 140 135 - 146 mmol/L Final  08/21/2016 143 137 - 147 mmol/L Final          Failed - Cr in normal range and within 180 days    Creat  Date Value Ref Range Status  12/09/2018 1.02 (H) 0.50 - 0.99 mg/dL Final    Comment:    For patients >58 years of age, the reference limit for Creatinine is approximately 13% higher for people identified as African-American. .           Failed - Ca in normal range and within 180 days    Calcium  Date Value Ref Range Status  12/09/2018 9.9 8.6 - 10.4 mg/dL Final          Passed - Patient is not pregnant      Passed - Last BP in normal range    BP Readings from Last 1 Encounters:  05/02/20 132/80          Passed - Valid encounter within last 6 months    Recent Outpatient Visits           1 year ago Other neutropenia [facility])   [facility] Steele Sizer, MD   3 years ago Essential hypertension   [facility] Steele Sizer, MD   3 years ago Well woman exam   [facility] Steele Sizer, MD   4 years ago Essential hypertension   [facility] Steele Sizer, MD   5 years ago Annual physical exam   [facility] Ashok Norris, MD       Future Appointments              In 1 month Ancil Boozer, Drue Stager, MD [facility] [place]   In 11 months  [facility]

## 2020-07-10 ENCOUNTER — Other Ambulatory Visit: Payer: Self-pay | Admitting: Family Medicine

## 2020-07-10 DIAGNOSIS — Z1231 Encounter for screening mammogram for malignant neoplasm of breast: Secondary | ICD-10-CM

## 2020-07-24 ENCOUNTER — Encounter: Payer: PPO | Admitting: Family Medicine

## 2020-08-29 ENCOUNTER — Other Ambulatory Visit: Payer: Self-pay | Admitting: Family Medicine

## 2020-08-29 DIAGNOSIS — I10 Essential (primary) hypertension: Secondary | ICD-10-CM

## 2020-08-29 MED ORDER — LOSARTAN POTASSIUM-HCTZ 50-12.5 MG PO TABS: 1.0000 | ORAL_TABLET | Freq: Every day | ORAL | 0 refills | Status: DC

## 2020-08-29 NOTE — Telephone Encounter (Signed)
Medication Refill - Medication: Losartan- Hydrochlorothiazide   Has the patient contacted their pharmacy? No. PT states that she has an appt on 12/03/20. Please advise.  (Agent: If no, request that the patient contact the pharmacy for the refill.) (Agent: If yes, when and what did the pharmacy advise?)  Preferred Pharmacy (with phone number or street name):  [id number] [address]  [place] [address]  Phone: [phone] Fax: [phone]  Hours: Not open 24 hours     Agent: Please be advised that RX refills may take up to 3 business days. We ask that you follow-up with your pharmacy.

## 2020-09-04 ENCOUNTER — Ambulatory Visit
Admission: RE | Admit: 2020-09-04 | Discharge: 2020-09-04 | Disposition: A | Discharge status: Home / Self Care | Payer: PPO | Source: Ambulatory Visit | Attending: Family Medicine | Admitting: Family Medicine

## 2020-09-04 ENCOUNTER — Other Ambulatory Visit: Payer: Self-pay

## 2020-09-04 DIAGNOSIS — Z1231 Encounter for screening mammogram for malignant neoplasm of breast: Secondary | ICD-10-CM | POA: Insufficient documentation

## 2020-09-05 ENCOUNTER — Other Ambulatory Visit: Payer: Self-pay | Admitting: Family Medicine

## 2020-09-05 DIAGNOSIS — N6310 Unspecified lump in the right breast, unspecified quadrant: Secondary | ICD-10-CM

## 2020-09-05 DIAGNOSIS — N6489 Other specified disorders of breast: Secondary | ICD-10-CM

## 2020-09-05 DIAGNOSIS — R928 Other abnormal and inconclusive findings on diagnostic imaging of breast: Secondary | ICD-10-CM

## 2020-09-11 ENCOUNTER — Ambulatory Visit
Admission: RE | Admit: 2020-09-11 | Discharge: 2020-09-11 | Disposition: A | Discharge status: Home / Self Care | Payer: PPO | Source: Ambulatory Visit | Attending: Family Medicine | Admitting: Family Medicine

## 2020-09-11 ENCOUNTER — Other Ambulatory Visit: Payer: Self-pay | Admitting: Family Medicine

## 2020-09-11 ENCOUNTER — Other Ambulatory Visit: Payer: Self-pay

## 2020-09-11 DIAGNOSIS — N6489 Other specified disorders of breast: Secondary | ICD-10-CM | POA: Insufficient documentation

## 2020-09-11 DIAGNOSIS — N6310 Unspecified lump in the right breast, unspecified quadrant: Secondary | ICD-10-CM | POA: Diagnosis not present

## 2020-09-11 DIAGNOSIS — R928 Other abnormal and inconclusive findings on diagnostic imaging of breast: Secondary | ICD-10-CM

## 2020-09-11 DIAGNOSIS — R921 Mammographic calcification found on diagnostic imaging of breast: Secondary | ICD-10-CM

## 2020-09-26 ENCOUNTER — Ambulatory Visit
Admission: RE | Admit: 2020-09-26 | Discharge: 2020-09-26 | Disposition: A | Discharge status: Home / Self Care | Payer: PPO | Source: Ambulatory Visit | Attending: Family Medicine | Admitting: Family Medicine

## 2020-09-26 ENCOUNTER — Other Ambulatory Visit: Payer: Self-pay

## 2020-09-26 DIAGNOSIS — R921 Mammographic calcification found on diagnostic imaging of breast: Secondary | ICD-10-CM

## 2020-09-26 DIAGNOSIS — R928 Other abnormal and inconclusive findings on diagnostic imaging of breast: Secondary | ICD-10-CM

## 2020-09-26 DIAGNOSIS — N6031 Fibrosclerosis of right breast: Secondary | ICD-10-CM | POA: Diagnosis not present

## 2020-09-26 DIAGNOSIS — N6489 Other specified disorders of breast: Secondary | ICD-10-CM

## 2020-09-26 HISTORY — PX: BREAST BIOPSY: SHX20

## 2020-09-27 LAB — SURGICAL PATHOLOGY

## 2020-11-27 ENCOUNTER — Encounter: Payer: PPO | Admitting: Family Medicine

## 2020-12-02 NOTE — Progress Notes (Signed)
Name: Madeline Pitts   MRN: [id number]    DOB: January 26, 1951   Date:12/03/2020       Progress Note  Subjective  Chief Complaint  Annual Exam  HPI  Patient presents for annual CPE.    Diet: she states she can do better  Exercise:  continue regular physical activity   [place] Office Visit from 12/19/2018 in [facility]  AUDIT-C Score 0      Depression: Phq 9 is  negative Depression screen [facility] 2/9 12/03/2020 05/02/2020 03/28/2019 12/19/2018 12/19/2018  Decreased Interest 0 0 0 0 0  Down, Depressed, Hopeless 0 0 0 0 0  PHQ - 2 Score 0 0 0 0 0  Altered sleeping - - - 0 -  Tired, decreased energy - - - 0 0  Change in appetite - - - 0 0  Feeling bad or failure about yourself  - - - 0 0  Trouble concentrating - - - 0 0  Moving slowly or fidgety/restless - - - 0 0  Suicidal thoughts - - - 0 0  PHQ-9 Score - - - 0 -   Hypertension: BP Readings from Last 3 Encounters:  12/03/20 130/82  05/02/20 132/80  03/28/19 140/82   Obesity: Wt Readings from Last 3 Encounters:  12/03/20 201 lb (91.2 kg)  05/02/20 192 lb 4.8 oz (87.2 kg)  03/28/19 187 lb 9.6 oz (85.1 kg)   BMI Readings from Last 3 Encounters:  12/03/20 37.98 kg/m  05/02/20 36.33 kg/m  03/28/19 35.45 kg/m     Vaccines:   Shingrix: 32-64 yo and ask insurance if covered when patient above 31 yo sending it pharmacy today  Pneumonia: educated and discussed with patient. Today PCV 20  Flu: educated and discussed with patient.  Hep C Screening: 08/21/16 STD testing and prevention (HIV/chl/gon/syphilis): Not interested  Intimate partner violence:negative Sexual History : no pain or discomfort  Menstrual History/LMP/Abnormal Bleeding: discussed post-menopausal bleeding  Incontinence Symptoms: no problems   Breast cancer:  - Last Mammogram: 09/04/20 - BRCA gene screening: N/A  Osteoporosis: Discussed high calcium and vitamin D supplementation, weight bearing exercises  Cervical cancer  screening: she sees gyn   Skin cancer: Discussed monitoring for atypical lesions  Colorectal cancer: 10/06/17   Lung cancer:  Low Dose CT Chest recommended if Age 59-80 years, 20 pack-year currently smoking OR have quit w/in 15years. Patient does not qualify.   ECG: 08/26/16  Advanced Care Planning: A voluntary discussion about advance care planning including the explanation and discussion of advance directives.  Discussed health care proxy and Living will, and the patient was able to identify a health care proxy as husband   Lipids: Lab Results  Component Value Date   CHOL 243 (H) 12/09/2018   CHOL 199 08/21/2016   CHOL 203 (H) 04/25/2015   Lab Results  Component Value Date   HDL 50 12/09/2018   HDL 47 08/21/2016   HDL 50 04/25/2015   Lab Results  Component Value Date   LDLCALC 168 (H) 12/09/2018   LDLCALC 131 08/21/2016   LDLCALC 137 (H) 04/25/2015   Lab Results  Component Value Date   TRIG 118 12/09/2018   TRIG 106 08/21/2016   TRIG 80 04/25/2015   Lab Results  Component Value Date   CHOLHDL 4.9 12/09/2018   CHOLHDL 4.1 04/25/2015   No results found for: LDLDIRECT  Glucose: Glucose  Date Value Ref Range Status  04/25/2015 110 (H) 65 - 99 mg/dL Final   Glucose,  Bld  Date Value Ref Range Status  12/09/2018 106 (H) 65 - 99 mg/dL Final    Comment:    .            Fasting reference interval . For someone without known diabetes, a glucose value between 100 and 125 mg/dL is consistent with prediabetes and should be confirmed with a follow-up test. .     Patient Active Problem List   Diagnosis Date Noted   Pre-diabetes 08/26/2016   Dyslipidemia 08/26/2016   Fibroid 04/25/2015   BP (high blood pressure) 04/25/2015   Climacteric 09/08/2012   Menopausal symptom 09/08/2012   Adult BMI 30+ 12/16/2010    Past Surgical History:  Procedure Laterality Date   BREAST BIOPSY Right 09/26/2020   stereo biopsy/ coil clip/ path pending   BREAST BIOPSY Right  09/26/2020   stereo biopsy/ coil clip/ path pending   COLONOSCOPY  2009?   Dr Alveta Heimlich   COLONOSCOPY WITH PROPOFOL N/A 10/06/2017   Procedure: COLONOSCOPY WITH PROPOFOL;  Surgeon: Robert Bellow, MD;  Location: [facility] ENDOSCOPY;  Service: Endoscopy;  Laterality: N/A;   ENDOMETRIAL ABLATION      Family History  Problem Relation Age of Onset   Uterine cancer Mother    Gout Father    COPD Father    Emphysema Father        Was a heavy smoker   Lung cancer Brother        smoker   Lung cancer Brother    Breast cancer Neg Hx     Social History   Socioeconomic History   Marital status: Married    Spouse name: Karle Starch    Number of children: 2   Years of education: Not on file   Highest education level: Bachelor's degree (e.g., BA, AB, BS)  Occupational History   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   Tobacco Use   Smoking status: Never   Smokeless tobacco: Never  Vaping Use   Vaping Use: Never used  Substance and Sexual Activity   Alcohol use: No    Alcohol/week: 0.0 standard drinks   Drug use: No   Sexual activity: Yes    Partners: Male    Birth control/protection: Post-menopausal  Other Topics Concern   Not on file  Social History Narrative   Not on file   Social Determinants of Health   Financial Resource Strain: Low Risk    Difficulty of Paying Living Expenses: Not hard at all  Food Insecurity: No Food Insecurity   Worried About Charity fundraiser in the Last Year: Never true   [place] in the Last Year: Never true  Transportation Needs: No Transportation Needs   Lack of Transportation (Medical): No   Lack of Transportation (Non-Medical): No  Physical Activity: Sufficiently Active   Days of Exercise per Week: 4 days   Minutes of Exercise per Session: 40 min  Stress: No Stress Concern Present   Feeling of Stress : Not at all  Social Connections: Socially Integrated   Frequency of Communication with Friends and Family: More than three times a  week   Frequency of Social Gatherings with Friends and Family: Once a week   Attends Religious Services: More than 4 times per year   Active Member of [employer] or Organizations: Yes   Attends Music therapist: More than 4 times per year   Marital Status: Married  Human resources officer Violence: Not At Risk   Fear of Current or Ex-Partner:  No   Emotionally Abused: No   Physically Abused: No   Sexually Abused: No     Current Outpatient Medications:    aspirin EC 81 MG tablet, Take 1 tablet (81 mg total) by mouth daily., Disp: 30 tablet, Rfl: 0   estrogen, conjugated,-medroxyprogesterone (PREMPRO) 0.45-1.5 MG tablet, Take by mouth., Disp: , Rfl:    GLUCOSAMINE SULFATE-MSM PO, Take 2 tablets by mouth once., Disp: , Rfl:    losartan-hydrochlorothiazide (HYZAAR) 50-12.5 MG tablet, Take 1 tablet by mouth daily., Disp: 90 tablet, Rfl: 0   Multiple Vitamin (MULTI-VITAMINS) TABS, Take by mouth., Disp: , Rfl:    Omega-3 Fatty Acids (FISH OIL) 1000 MG CAPS, Take 1 capsule by mouth 3 (three) times a week., Disp: , Rfl:    TURMERIC PO, Take by mouth., Disp: , Rfl:    zinc gluconate 50 MG tablet, Take 50 mg by mouth 2 (two) times a week., Disp: , Rfl:   Allergies  Allergen Reactions   Codeine Nausea Only and Other (See Comments)    Makes patient feel lethargic-can't move   Penicillins Nausea Only     ROS  Constitutional: Negative for fever or weight change.  Respiratory: Negative for cough and shortness of breath.   Cardiovascular: Negative for chest pain or palpitations.  Gastrointestinal: Negative for abdominal pain, no bowel changes.  Musculoskeletal: Negative for gait problem or joint swelling.  Skin: Negative for rash.  Neurological: Negative for dizziness or headache.  No other specific complaints in a complete review of systems (except as listed in HPI above).   Objective  Vitals:   12/03/20 1104  BP: 130/82  Pulse: 81  Resp: 16  Temp: 98.4 F (36.9 C)  TempSrc: Oral   SpO2: 98%  Weight: 201 lb (91.2 kg)  Height: '5\' 1"'  (1.549 m)    Body mass index is 37.98 kg/m.  Physical Exam  Constitutional: Patient appears well-developed and well-nourished. No distress.  HENT: Head: Normocephalic and atraumatic. Ears: B TMs ok, no erythema or effusion; Nose: Nose normal. Mouth/Throat: not done  Eyes: Conjunctivae and EOM are normal. Pupils are equal, round, and reactive to light. No scleral icterus.  Neck: Normal range of motion. Neck supple. No JVD present. No thyromegaly present.  Cardiovascular: Normal rate, regular rhythm and normal heart sounds.  No murmur heard. No BLE edema. Pulmonary/Chest: Effort normal and breath sounds normal. No respiratory distress. Abdominal: Soft. Bowel sounds are normal, no distension. There is no tenderness. no masses Breast: no lumps or masses, no nipple discharge or rashes FEMALE GENITALIA:  Not done  RECTAL: not done  Musculoskeletal: Normal range of motion, no joint effusions. No gross deformities Neurological: he is alert and oriented to person, place, and time. No cranial nerve deficit. Coordination, balance, strength, speech and gait are normal.  Skin: Skin is warm and dry. No rash noted. No erythema.  Psychiatric: Patient has a normal mood and affect. behavior is normal. Judgment and thought content normal.   Recent Results (from the past 2160 hour(s))  Surgical pathology     Status: None   Collection Time: 09/26/20  1:33 PM  Result Value Ref Range   SURGICAL PATHOLOGY      SURGICAL PATHOLOGY CASE: [id number] PATIENT: [facility] Surgical Pathology Report     Specimen Submitted: A. Breast, right, UOQ middle coil B. Breast, right, UOQ posterior ribbon  Clinical History: 70 year old female with right breast asymmetries.  [place] vs [place]; 1. UOQ middle (coil) 2. UOQ posterior (ribbon)  DIAGNOSIS: A. BREAST DISTORTION, RIGHT UPPER OUTER QUADRANT MIDDLE (COIL); STEREOTACTIC BIOPSY: - SCLEROSING  ADENOSIS WITH FOCAL CALCIFICATION, PSEUDOANGIOMATOUS STROMAL HYPERPLASIA, COLUMNAR CELL CHANGE, APOCRINE METAPLASIA, AND SIMPLE CYSTS. - NEGATIVE FOR ATYPIA AND MALIGNANCY.  B. BREAST DISTORTION, RIGHT UPPER OUTER QUADRANT POSTERIOR (RIBBON); STEREOTACTIC BIOPSY: - PSEUDOANGIOMATOUS STROMAL HYPERPLASIA, SCLEROSING ADENOSIS, COLUMNAR CELL CHANGE, AND SIMPLE CYST. - NEGATIVE FOR ATYPIA AND MALIGNANCY.  GROSS DESCRIPTION: A. Labeled: Right breast stereo biopsy asymmetry with calcs upper outer quadrant middle Received: in a form alin-filled [place] collection device Specimen radiograph image(s) available for review Time/Date in fixative: Collected at 12:59 PM on 09/26/2020 and placed in formalin at 1:03 PM on 09/26/2020 Cold ischemic time: Less than 15 minutes Total fixation time: Approximately 6.75 hours Core pieces: Multiple Measurement: Aggregate, 6.2 x 1 x 0.3 cm Description / comments: Received are cores and fragments of yellow fibrofatty tissue.  The accompanying diagram has sections A, B, C, E, G, and H checked. Inked: Black Entirely submitted in cassette(s):  1 - section A 2 - section B 3 - section C 4 - section E 5 - section G 6 - section H 7 - sections D, F, and I 8 - sections J, K and free-floating fragments  B. Labeled: Right breast stereo biopsy asymmetry with upper outer quadrant posterior calcs Received: in a formalin-filled Bre[place]a collection device Specimen radiograph image(s) available for review Time/Date in fixative: Collected at 1:16 PM on 09/26/2020 and placed in  formalin at 1:19 PM on 09/26/2020 Cold ischemic time: Less than 5 minutes Total fixation time: Approximately 6.5 hours Core pieces: Multiple Measurement: Aggregate, 6.2 x 1.2 x 0.3 cm Description / comments: Received are cores and fragments of yellow fibrofatty tissue.  The accompanying diagram has section E checked. Inked: Blue Entirely submitted in cassette(s):  1 - section E 2 - sections  A and B 3 - sections C and D 4 - sections F, G, and remaining free-floating fragments  RB 09/26/2020  Final Diagnosis performed by Quay Burow, MD.   Electronically signed 09/27/2020 11:10:12AM The electronic signature indicates that the named Attending Pathologist has evaluated the specimen Technical component performed at [facility], [address], [place], [place] [address] Lab: [phone] Dir: Rush Farmer, MD, MMM  Professional component performed at [facility], [facility], [place], [place], [place] [address] Lab: [phone] Dir: Dellia Nims. Rubinas,  MD      Fall Risk: Fall Risk  12/03/2020 05/02/2020 03/28/2019 12/19/2018 06/07/2017  Falls in the past year? 0 0 0 0 No  Number falls in past yr: 0 0 0 0 -  Injury with Fall? 0 0 0 0 -  Risk for fall due to : - No Fall Risks - - -  Follow up - Falls prevention discussed Falls prevention discussed - -     Functional Status Survey: Is the patient deaf or have difficulty hearing?: No Does the patient have difficulty seeing, even when wearing glasses/contacts?: No Does the patient have difficulty concentrating, remembering, or making decisions?: No Does the patient have difficulty walking or climbing stairs?: No Does the patient have difficulty dressing or bathing?: No Does the patient have difficulty doing errands alone such as visiting a doctor's office or shopping?: No   Assessment & Plan  1. Well adult exam   2. Need for pneumococcal vaccine  - Pneumococcal conjugate vaccine 20-valent (Prevnar 20)  3. Other neutropenia ([place])   4. Dyslipidemia  - Lipid panel  5. Pre-diabetes   6. Vitamin D deficiency  -  VITAMIN D 25 Hydroxy (Vit-D Deficiency, Fractures)  7. Hyperglycemia  - Hemoglobin A1c  8. Essential hypertension  - CBC with Differential/Platelet - COMPLETE METABOLIC PANEL WITH GFR  9. Need for shingles vaccine  - Zoster Vaccine Adjuvanted [facility]) injection; Inject 0.5  mLs into the muscle once for 1 dose.  Dispense: 0.5 mL; Refill: 1  10. Abnormal mammogram of right breast  - MM Digital Diagnostic Unilat R; Future - US BREAST LTD UNI RIGHT INC AXILLA; Future    -USPSTF grade A and B recommendations reviewed with patient; age-appropriate recommendations, preventive care, screening tests, etc discussed and encouraged; healthy living encouraged; see AVS for patient education given to patient -Discussed importance of 150 minutes of physical activity weekly, eat two servings of fish weekly, eat one serving of tree nuts ( cashews, pistachios, pecans, almonds.Marland Kitchen) every other day, eat 6 servings of fruit/vegetables daily and drink plenty of water and avoid sweet beverages.

## 2020-12-02 NOTE — Patient Instructions (Signed)
Preventive Care 68 Years and Older, Female Preventive care refers to lifestyle choices and visits with your health care provider that can promote health and wellness. This includes: A yearly physical exam. This is also called an annual wellness visit. Regular dental and eye exams. Immunizations. Screening for certain conditions. Healthy lifestyle choices, such as: Eating a healthy diet. Getting regular exercise. Not using drugs or products that contain nicotine and tobacco. Limiting alcohol use. What can I expect for my preventive care visit? Physical exam Your health care provider will check your: Height and weight. These may be used to calculate your BMI (body mass index). BMI is a measurement that tells if you are at a healthy weight. Heart rate and blood pressure. Body temperature. Skin for abnormal spots. Counseling Your health care provider may ask you questions about your: Past medical problems. Family's medical history. Alcohol, tobacco, and drug use. Emotional well-being. Home life and relationship well-being. Sexual activity. Diet, exercise, and sleep habits. History of falls. Memory and ability to understand (cognition). Work and work Statistician. Pregnancy and menstrual history. Access to firearms. What immunizations do I need?  Vaccines are usually given at various ages, according to a schedule. Your health care provider will recommend vaccines for you based on your age, medicalhistory, and lifestyle or other factors, such as travel or where you work. What tests do I need? Blood tests Lipid and cholesterol levels. These may be checked every 5 years, or more often depending on your overall health. Hepatitis C test. Hepatitis B test. Screening Lung cancer screening. You may have this screening every year starting at age 44 if you have a 30-pack-year history of smoking and currently smoke or have quit within the past 15 years. Colorectal cancer screening. All  adults should have this screening starting at age 39 and continuing until age 65. Your health care provider may recommend screening at age 61 if you are at increased risk. You will have tests every 1-10 years, depending on your results and the type of screening test. Diabetes screening. This is done by checking your blood sugar (glucose) after you have not eaten for a while (fasting). You may have this done every 1-3 years. Mammogram. This may be done every 1-2 years. Talk with your health care provider about how often you should have regular mammograms. Abdominal aortic aneurysm (AAA) screening. You may need this if you are a current or former smoker. BRCA-related cancer screening. This may be done if you have a family history of breast, ovarian, tubal, or peritoneal cancers. Other tests STD (sexually transmitted disease) testing, if you are at risk. Bone density scan. This is done to screen for osteoporosis. You may have this done starting at age 54. Talk with your health care provider about your test results, treatment options,and if necessary, the need for more tests. Follow these instructions at home: Eating and drinking  Eat a diet that includes fresh fruits and vegetables, whole grains, lean protein, and low-fat dairy products. Limit your intake of foods with high amounts of sugar, saturated fats, and salt. Take vitamin and mineral supplements as recommended by your health care provider. Do not drink alcohol if your health care provider tells you not to drink. If you drink alcohol: Limit how much you have to 0-1 drink a day. Be aware of how much alcohol is in your drink. In the [place] one drink equals one 12 oz bottle of beer (355 mL), one 5 oz glass of wine (148 mL), or one 1  oz glass of hard liquor (44 mL).  Lifestyle Take daily care of your teeth and gums. Brush your teeth every morning and night with fluoride toothpaste. Floss one time each day. Stay active. Exercise for at  least 30 minutes 5 or more days each week. Do not use any products that contain nicotine or tobacco, such as cigarettes, e-cigarettes, and chewing tobacco. If you need help quitting, ask your health care provider. Do not use drugs. If you are sexually active, practice safe sex. Use a condom or other form of protection in order to prevent STIs (sexually transmitted infections). Talk with your health care provider about taking a low-dose aspirin or statin. Find healthy ways to cope with stress, such as: Meditation, yoga, or listening to music. Journaling. Talking to a trusted person. Spending time with friends and family. Safety Always wear your seat belt while driving or riding in a vehicle. Do not drive: If you have been drinking alcohol. Do not ride with someone who has been drinking. When you are tired or distracted. While texting. Wear a helmet and other protective equipment during sports activities. If you have firearms in your house, make sure you follow all gun safety procedures. What's next? Visit your health care provider once a year for an annual wellness visit. Ask your health care provider how often you should have your eyes and teeth checked. Stay up to date on all vaccines. This information is not intended to replace advice given to you by your health care provider. Make sure you discuss any questions you have with your healthcare provider. Document Revised: 04/17/2020 Document Reviewed: 04/21/2018 Elsevier Patient Education  2022 [employer].

## 2020-12-03 ENCOUNTER — Ambulatory Visit (INDEPENDENT_AMBULATORY_CARE_PROVIDER_SITE_OTHER): Payer: PPO | Admitting: Family Medicine

## 2020-12-03 ENCOUNTER — Other Ambulatory Visit: Payer: Self-pay | Admitting: Family Medicine

## 2020-12-03 ENCOUNTER — Encounter: Payer: Self-pay | Admitting: Family Medicine

## 2020-12-03 ENCOUNTER — Other Ambulatory Visit: Payer: Self-pay

## 2020-12-03 VITALS — BP 130/82 | HR 81 | Temp 98.4°F | Resp 16 | Ht 61.0 in | Wt 201.0 lb

## 2020-12-03 DIAGNOSIS — R739 Hyperglycemia, unspecified: Secondary | ICD-10-CM

## 2020-12-03 DIAGNOSIS — Z23 Encounter for immunization: Secondary | ICD-10-CM

## 2020-12-03 DIAGNOSIS — R928 Other abnormal and inconclusive findings on diagnostic imaging of breast: Secondary | ICD-10-CM

## 2020-12-03 DIAGNOSIS — D708 Other neutropenia: Secondary | ICD-10-CM | POA: Diagnosis not present

## 2020-12-03 DIAGNOSIS — Z0000 Encounter for general adult medical examination without abnormal findings: Secondary | ICD-10-CM

## 2020-12-03 DIAGNOSIS — E785 Hyperlipidemia, unspecified: Secondary | ICD-10-CM | POA: Diagnosis not present

## 2020-12-03 DIAGNOSIS — R7303 Prediabetes: Secondary | ICD-10-CM | POA: Diagnosis not present

## 2020-12-03 DIAGNOSIS — E559 Vitamin D deficiency, unspecified: Secondary | ICD-10-CM | POA: Diagnosis not present

## 2020-12-03 DIAGNOSIS — I10 Essential (primary) hypertension: Secondary | ICD-10-CM

## 2020-12-03 MED ORDER — SHINGRIX 50 MCG/0.5ML IM SUSR: 0.5000 mL | Freq: Once | INTRAMUSCULAR | 1 refills | Status: AC

## 2020-12-03 MED ORDER — LOSARTAN POTASSIUM-HCTZ 50-12.5 MG PO TABS: 1.0000 | ORAL_TABLET | Freq: Every day | ORAL | 0 refills | Status: DC

## 2020-12-04 LAB — COMPLETE METABOLIC PANEL WITH GFR
AG Ratio: 1.7 (calc) (ref 1.0–2.5)
ALT: 15 U/L (ref 6–29)
AST: 16 U/L (ref 10–35)
Albumin: 4.2 g/dL (ref 3.6–5.1)
Alkaline phosphatase (APISO): 66 U/L (ref 37–153)
BUN: 15 mg/dL (ref 7–25)
CO2: 29 mmol/L (ref 20–32)
Calcium: 9.8 mg/dL (ref 8.6–10.4)
Chloride: 101 mmol/L (ref 98–110)
Creat: 0.95 mg/dL (ref 0.50–1.05)
Globulin: 2.5 g/dL (calc) (ref 1.9–3.7)
Glucose, Bld: 91 mg/dL (ref 65–99)
Potassium: 4 mmol/L (ref 3.5–5.3)
Sodium: 139 mmol/L (ref 135–146)
Total Bilirubin: 0.5 mg/dL (ref 0.2–1.2)
Total Protein: 6.7 g/dL (ref 6.1–8.1)
eGFR: 65 mL/min/{1.73_m2} (ref >=60)

## 2020-12-04 LAB — LIPID PANEL
Cholesterol: 225 mg/dL — ABNORMAL HIGH (ref <200)
HDL: 51 mg/dL (ref >=50)
LDL Cholesterol (Calc): 148 mg/dL (calc) — ABNORMAL HIGH
Non-HDL Cholesterol (Calc): 174 mg/dL (calc) — ABNORMAL HIGH (ref <130)
Total CHOL/HDL Ratio: 4.4 (calc) (ref <5.0)
Triglycerides: 139 mg/dL (ref <150)

## 2020-12-04 LAB — CBC WITH DIFFERENTIAL/PLATELET
Absolute Monocytes: 307 cells/uL (ref 200–950)
Basophils Absolute: 30 cells/uL (ref 0–200)
Basophils Relative: 0.8 %
Eosinophils Absolute: 78 cells/uL (ref 15–500)
Eosinophils Relative: 2.1 %
HCT: 33.4 % — ABNORMAL LOW (ref 35.0–45.0)
Hemoglobin: 10.5 g/dL — ABNORMAL LOW (ref 11.7–15.5)
Lymphs Abs: 1140 cells/uL (ref 850–3900)
MCH: 24.9 pg — ABNORMAL LOW (ref 27.0–33.0)
MCHC: 31.4 g/dL — ABNORMAL LOW (ref 32.0–36.0)
MCV: 79.3 fL — ABNORMAL LOW (ref 80.0–100.0)
MPV: 9.5 fL (ref 7.5–12.5)
Monocytes Relative: 8.3 %
Neutro Abs: 2146 cells/uL (ref 1500–7800)
Neutrophils Relative %: 58 %
Platelets: 350 10*3/uL (ref 140–400)
RBC: 4.21 10*6/uL (ref 3.80–5.10)
RDW: 16.2 % — ABNORMAL HIGH (ref 11.0–15.0)
Total Lymphocyte: 30.8 %
WBC: 3.7 10*3/uL — ABNORMAL LOW (ref 3.8–10.8)

## 2020-12-04 LAB — VITAMIN D 25 HYDROXY (VIT D DEFICIENCY, FRACTURES): Vit D, 25-Hydroxy: 53 ng/mL (ref 30–100)

## 2020-12-04 LAB — HEMOGLOBIN A1C
Hgb A1c MFr Bld: 6.1 % of total Hgb — ABNORMAL HIGH (ref <5.7)
Mean Plasma Glucose: 128 mg/dL
eAG (mmol/L): 7.1 mmol/L

## 2020-12-19 ENCOUNTER — Other Ambulatory Visit: Payer: Self-pay

## 2020-12-19 ENCOUNTER — Ambulatory Visit (INDEPENDENT_AMBULATORY_CARE_PROVIDER_SITE_OTHER): Payer: PPO | Admitting: Family Medicine

## 2020-12-19 VITALS — BP 132/68 | HR 93 | Temp 98.3°F | Resp 16 | Ht 61.0 in | Wt 194.0 lb

## 2020-12-19 DIAGNOSIS — L853 Xerosis cutis: Secondary | ICD-10-CM | POA: Diagnosis not present

## 2020-12-19 DIAGNOSIS — D649 Anemia, unspecified: Secondary | ICD-10-CM | POA: Diagnosis not present

## 2020-12-19 DIAGNOSIS — D708 Other neutropenia: Secondary | ICD-10-CM | POA: Diagnosis not present

## 2020-12-19 DIAGNOSIS — Z23 Encounter for immunization: Secondary | ICD-10-CM

## 2020-12-19 DIAGNOSIS — I10 Essential (primary) hypertension: Secondary | ICD-10-CM | POA: Diagnosis not present

## 2020-12-19 DIAGNOSIS — L2489 Irritant contact dermatitis due to other agents: Secondary | ICD-10-CM | POA: Diagnosis not present

## 2020-12-19 DIAGNOSIS — E559 Vitamin D deficiency, unspecified: Secondary | ICD-10-CM | POA: Diagnosis not present

## 2020-12-19 DIAGNOSIS — E785 Hyperlipidemia, unspecified: Secondary | ICD-10-CM

## 2020-12-19 DIAGNOSIS — R7303 Prediabetes: Secondary | ICD-10-CM

## 2020-12-19 MED ORDER — LOSARTAN POTASSIUM-HCTZ 50-12.5 MG PO TABS: 1.0000 | ORAL_TABLET | Freq: Every day | ORAL | 1 refills | Status: DC

## 2020-12-19 MED ORDER — HYDROCORTISONE 2.5 % EX CREA: TOPICAL_CREAM | Freq: Two times a day (BID) | CUTANEOUS | 0 refills | Status: DC

## 2020-12-19 MED ORDER — AMMONIUM LACTATE 12 % EX LOTN: 1.0000 "application " | TOPICAL_LOTION | CUTANEOUS | 3 refills | Status: DC | PRN

## 2020-12-19 NOTE — Progress Notes (Signed)
Name: Madeline [facility]   MRN: [id number]    DOB: 1950-05-18   Date:12/19/2020       Progress Note  Subjective  Chief Complaint  Follow Up  HPI  HTN: she has been taking medication as prescribed, and denies side effects. No chest pain, dizziness  or palpitation   Leucopenia: slight improvement since last year    Morbid obeisty:  she has HTN and hyperglycemia and dyslipidemia with a BMI above 35.   Vitamin D deficiency: taking otc supplementation twice a week and last level at goal    Pre-diabetes: last hgbA1C was 6.1% and stable. She denies polydipsia, polyuria or polyphagia. She has not changed her diet   Hyperlipidemia: based on calculation below I recommended patient to start taking statins   The 10-year ASCVD risk score Madeline Bussing DC Brooke Bonito., et al., 2013) is: 12.8%   Values used to calculate the score:     Age: 70 years     Sex: Female     Is Non-Hispanic African American: Yes     Diabetic: No     Tobacco smoker: No     Systolic Blood Pressure: 280 mmHg     Is BP treated: Yes     HDL Cholesterol: 51 mg/dL     Total Cholesterol: 225 mg/dL     Past [facility] History:  Procedure Laterality Date   BREAST BIOPSY Right 09/26/2020   stereo biopsy/ coil clip/ path pending   BREAST BIOPSY Right 09/26/2020   stereo biopsy/ coil clip/ path pending   COLONOSCOPY  2009?   Dr [facility]   COLONOSCOPY WITH PROPOFOL N/A 10/06/2017   Procedure: COLONOSCOPY WITH PROPOFOL;  Surgeon: Madeline Bellow, MD;  Location: [facility];  Service: Endoscopy;  Laterality: N/A;   ENDOMETRIAL ABLATION      Family History  Problem Relation Age of Onset   Uterine cancer Mother    Gout Father    COPD Father    Emphysema Father        Was a heavy smoker   Lung cancer Brother        smoker   Lung cancer Brother    Breast cancer Neg Hx     Social History   Tobacco Use   Smoking status: Never   Smokeless tobacco: Never  Substance Use Topics   Alcohol use: No    Alcohol/week: 0.0 standard  drinks     Current Outpatient Medications:    aspirin EC 81 MG tablet, Take 1 tablet (81 mg total) by mouth daily., Disp: 30 tablet, Rfl: 0   estrogen, conjugated,-medroxyprogesterone (PREMPRO) 0.45-1.5 MG tablet, Take by mouth., Disp: , Rfl:    Ginseng 250 MG CAPS, Take by mouth., Disp: , Rfl:    GLUCOSAMINE SULFATE-MSM PO, Take 2 tablets by mouth once., Disp: , Rfl:    losartan-hydrochlorothiazide (HYZAAR) 50-12.5 MG tablet, Take 1 tablet by mouth daily., Disp: 30 tablet, Rfl: 0   Multiple Vitamin (MULTI-VITAMINS) TABS, Take by mouth., Disp: , Rfl:    Omega-3 Fatty Acids (FISH OIL) 1000 MG CAPS, Take 1 capsule by mouth 3 (three) times a week., Disp: , Rfl:    TURMERIC PO, Take by mouth., Disp: , Rfl:    vitamin B-12 (CYANOCOBALAMIN) 500 MCG tablet, Take 500 mcg by mouth daily., Disp: , Rfl:    vitamin C (ASCORBIC ACID) 500 MG tablet, Take 500 mg by mouth daily., Disp: , Rfl:    zinc gluconate 50 MG tablet, Take 50 mg by mouth 2 (two) times  a week., Disp: , Rfl:   Allergies  Allergen Reactions   Codeine Nausea Only and Other (See Comments)    Makes patient feel lethargic-can't move   Penicillins Nausea Only    I personally reviewed active problem list, medication list, allergies, family history, social history, health maintenance with the patient/caregiver today.   ROS  Constitutional: Negative for fever , positive for weight change.  Respiratory: Negative for cough and shortness of breath.   Cardiovascular: Negative for chest pain or palpitations.  Gastrointestinal: Negative for abdominal pain, no bowel changes.  Musculoskeletal: Negative for gait problem or joint swelling.  Skin: Negative for rash.  Neurological: Negative for dizziness or headache.  No other specific complaints in a complete review of systems (except as listed in HPI above).   Objective  Vitals:   12/19/20 1058  BP: 132/68  Pulse: 93  Resp: 16  Temp: 98.3 F (36.8 C)  SpO2: 98%  Weight: 194 lb (88  kg)  Height: '5\' 1"'  (1.549 m)    Body mass index is 36.66 kg/m.  Physical Exam  Constitutional: Patient appears well-developed and well-nourished. Obese  No distress.  HEENT: head atraumatic, normocephalic, pupils equal and reactive to light, neck supple Cardiovascular: Normal rate, regular rhythm and normal heart sounds.  No murmur heard. No BLE edema. Pulmonary/Chest: Effort normal and breath sounds normal. No respiratory distress. Abdominal: Soft.  There is no tenderness. Psychiatric: Patient has a normal mood and affect. behavior is normal. Judgment and thought content normal.   Recent Results (from the past 2160 hour(s))  [facility]     Status: None   Collection Time: 09/26/20  1:33 PM  Result Value Ref Range   [facility]      [facility] CASE: [id number] PATIENT: [facility] Report     Specimen Submitted: A. Breast, right, UOQ middle coil B. Breast, right, UOQ posterior ribbon  Clinical History: 70 year old female with right breast asymmetries.  [facility]; 1. UOQ middle (coil) 2. UOQ posterior (ribbon)      DIAGNOSIS: A. BREAST DISTORTION, RIGHT UPPER OUTER QUADRANT MIDDLE (COIL); STEREOTACTIC BIOPSY: - SCLEROSING ADENOSIS WITH FOCAL CALCIFICATION, PSEUDOANGIOMATOUS STROMAL HYPERPLASIA, COLUMNAR CELL CHANGE, APOCRINE METAPLASIA, AND SIMPLE CYSTS. - NEGATIVE FOR ATYPIA AND MALIGNANCY.  B. BREAST DISTORTION, RIGHT UPPER OUTER QUADRANT POSTERIOR (RIBBON); STEREOTACTIC BIOPSY: - PSEUDOANGIOMATOUS STROMAL HYPERPLASIA, SCLEROSING ADENOSIS, COLUMNAR CELL CHANGE, AND SIMPLE CYST. - NEGATIVE FOR ATYPIA AND MALIGNANCY.  GROSS DESCRIPTION: A. Labeled: Right breast stereo biopsy asymmetry with calcs upper outer quadrant middle Received: in a form alin-filled [place] collection device Specimen radiograph image(s) available for review Time/Date in fixative: Collected at 12:59 PM on 09/26/2020 and placed  in formalin at 1:03 PM on 09/26/2020 Cold ischemic time: Less than 15 minutes Total fixation time: Approximately 6.75 hours Core pieces: Multiple Measurement: Aggregate, 6.2 x 1 x 0.3 cm Description / comments: Received are cores and fragments of yellow fibrofatty tissue.  The accompanying diagram has sections A, B, C, E, G, and H checked. Inked: Black Entirely submitted in cassette(s):  1 - section A 2 - section B 3 - section C 4 - section E 5 - section G 6 - section H 7 - sections D, F, and I 8 - sections J, K and free-floating fragments  B. Labeled: Right breast stereo biopsy asymmetry with upper outer quadrant posterior calcs Received: in a formalin-filled [place] collection device Specimen radiograph image(s) available for review Time/Date in fixative: Collected at 1:16 PM on 09/26/2020  and placed in  formalin at 1:19 PM on 09/26/2020 Cold ischemic time: Less than 5 minutes Total fixation time: Approximately 6.5 hours Core pieces: Multiple Measurement: Aggregate, 6.2 x 1.2 x 0.3 cm Description / comments: Received are cores and fragments of yellow fibrofatty tissue.  The accompanying diagram has section E checked. Inked: Blue Entirely submitted in cassette(s):  1 - section E 2 - sections A and B 3 - sections C and D 4 - sections F, G, and remaining free-floating fragments  RB 09/26/2020  Final Diagnosis performed by Quay Burow, MD.   Electronically signed 09/27/2020 11:10:12AM The electronic signature indicates that the named Attending Pathologist has evaluated the specimen Technical component performed at [facility], [address] [place], [place] [address] Lab: [phone] Dir: Rush Farmer, MD, MMM  Professional component performed at [facility], [facility], [place], [place], [place] [address] Lab: [phone] Dir: Dellia Nims. Rubinas,  MD   Lipid panel     Status: Abnormal   Collection Time: 12/03/20 11:58 AM  Result Value Ref Range    Cholesterol 225 (H) <200 mg/dL   HDL 51 > OR = 50 mg/dL   Triglycerides 139 <150 mg/dL   LDL Cholesterol (Calc) 148 (H) mg/dL (calc)    Comment: Reference range: <100 . Desirable range <100 mg/dL for primary prevention;   <70 mg/dL for patients with CHD or diabetic patients  with > or = 2 CHD risk factors. Marland Kitchen LDL-C is now calculated using the Martin-Hopkins  calculation, which is a validated novel method providing  better accuracy than the Friedewald equation in the  estimation of LDL-C.  Cresenciano Genre et al. Annamaria Helling. 3568;616(83): 2061-2068  (http://education.[facility].com/faq/FAQ164)    Total CHOL/HDL Ratio 4.4 <5.0 (calc)   Non-HDL Cholesterol (Calc) 174 (H) <130 mg/dL (calc)    Comment: For patients with diabetes plus 1 major ASCVD risk  factor, treating to a non-HDL-C goal of <100 mg/dL  (LDL-C of <70 mg/dL) is considered a therapeutic  option.   CBC with Differential/Platelet     Status: Abnormal   Collection Time: 12/03/20 11:58 AM  Result Value Ref Range   WBC 3.7 (L) 3.8 - 10.8 Thousand/uL   RBC 4.21 3.80 - 5.10 Million/uL   Hemoglobin 10.5 (L) 11.7 - 15.5 g/dL   HCT 33.4 (L) 35.0 - 45.0 %   MCV 79.3 (L) 80.0 - 100.0 fL   MCH 24.9 (L) 27.0 - 33.0 pg   MCHC 31.4 (L) 32.0 - 36.0 g/dL   RDW 16.2 (H) 11.0 - 15.0 %   Platelets 350 140 - 400 Thousand/uL   MPV 9.5 7.5 - 12.5 fL   Neutro Abs 2,146 1,500 - 7,800 cells/uL   Lymphs Abs 1,140 850 - 3,900 cells/uL   Absolute Monocytes 307 200 - 950 cells/uL   Eosinophils Absolute 78 15 - 500 cells/uL   Basophils Absolute 30 0 - 200 cells/uL   Neutrophils Relative % 58 %   Total Lymphocyte 30.8 %   Monocytes Relative 8.3 %   Eosinophils Relative 2.1 %   Basophils Relative 0.8 %  COMPLETE METABOLIC PANEL WITH GFR     Status: None   Collection Time: 12/03/20 11:58 AM  Result Value Ref Range   Glucose, Bld 91 65 - 99 mg/dL    Comment: .            Fasting reference interval .    BUN 15 7 - 25 mg/dL   Creat 0.95 0.50 -  1.05 mg/dL  eGFR 65 > OR = 60 mL/min/1.60m    Comment: The eGFR is based on the CKD-EPI 2021 equation. To calculate  the new eGFR from a previous Creatinine or Cystatin C result, go to https://www.kidney.org/professionals/ kdoqi/gfr%5Fcalculator    BUN/Creatinine Ratio NOT APPLICABLE 6 - 22 (calc)   Sodium 139 135 - 146 mmol/L   Potassium 4.0 3.5 - 5.3 mmol/L   Chloride 101 98 - 110 mmol/L   CO2 29 20 - 32 mmol/L   Calcium 9.8 8.6 - 10.4 mg/dL   Total Protein 6.7 6.1 - 8.1 g/dL   Albumin 4.2 3.6 - 5.1 g/dL   Globulin 2.5 1.9 - 3.7 g/dL (calc)   AG Ratio 1.7 1.0 - 2.5 (calc)   Total Bilirubin 0.5 0.2 - 1.2 mg/dL   Alkaline phosphatase (APISO) 66 37 - 153 U/L   AST 16 10 - 35 U/L   ALT 15 6 - 29 U/L  Hemoglobin A1c     Status: Abnormal   Collection Time: 12/03/20 11:58 AM  Result Value Ref Range   Hgb A1c MFr Bld 6.1 (H) <5.7 % of total Hgb    Comment: For someone without known diabetes, a hemoglobin  A1c value between 5.7% and 6.4% is consistent with prediabetes and should be confirmed with a  follow-up test. . For someone with known diabetes, a value <7% indicates that their diabetes is well controlled. A1c targets should be individualized based on duration of diabetes, age, comorbid conditions, and other considerations. . This assay result is consistent with an increased risk of diabetes. . Currently, no consensus exists regarding use of hemoglobin A1c for diagnosis of diabetes for children. .    Mean Plasma Glucose 128 mg/dL   eAG (mmol/L) 7.1 mmol/L  VITAMIN D 25 Hydroxy (Vit-D Deficiency, Fractures)     Status: None   Collection Time: 12/03/20 11:58 AM  Result Value Ref Range   Vit D, 25-Hydroxy 53 30 - 100 ng/mL    Comment: Vitamin D Status         25-OH Vitamin D: . Deficiency:                    <20 ng/mL Insufficiency:             20 - 29 ng/mL Optimal:                 > or = 30 ng/mL . For 25-OH Vitamin D testing on patients on  D2-supplementation  and patients for whom quantitation  of D2 and D3 fractions is required, the [facility](TM) 25-OH VIT D, (D2,D3), LC/MS/MS is recommended: order  code [id number](patients >249yr. See Note 1 . Note 1 . For additional information, please refer to  http://education.[facility].com/faq/FAQ199  (This link is being provided for informational/ educational purposes only.)       [facility] 12/19/2020 12/03/2020 05/02/2020 03/28/2019 12/19/2018  Decreased Interest 0 0 0 0 0  Down, Depressed, Hopeless 0 0 0 0 0  PHQ - 2 Score 0 0 0 0 0  Altered sleeping - - - - 0  Tired, decreased energy - - - - 0  Change in appetite - - - - 0  Feeling bad or failure about yourself  - - - - 0  Trouble concentrating - - - - 0  Moving slowly or fidgety/restless - - - - 0  Suicidal thoughts - - - - 0  PHQ-9 Score - - - - 0  phq 9 is negative   Fall Risk: Fall Risk  12/19/2020 12/03/2020 05/02/2020 03/28/2019 12/19/2018  Pitts in the past year? 0 0 0 0 0  Number Pitts in past yr: 0 0 0 0 0  Injury with Fall? 0 0 0 0 0  Risk for fall due to : No Fall Risks - No Fall Risks - -  Follow up Pitts prevention discussed - Pitts prevention discussed Pitts prevention discussed -      Functional Status Survey: Is the patient deaf or have difficulty hearing?: No Does the patient have difficulty seeing, even when wearing glasses/contacts?: No Does the patient have difficulty concentrating, remembering, or making decisions?: No Does the patient have difficulty walking or climbing stairs?: No Does the patient have difficulty dressing or bathing?: No Does the patient have difficulty doing errands alone such as visiting a doctor's office or shopping?: No    Assessment & Plan  1. Anemia, unspecified type  - CBC with Differential/Platelet - Iron, TIBC and Ferritin Panel - B12 and Folate Panel  2. Other neutropenia ([place])   3. Need for shingles vaccine  Rx is at the pharmacy    4. Dyslipidemia  Refuses statin therapy   5. Pre-diabetes  Discussed low carb diet   6. Vitamin D deficiency   7. Morbid obesity ([place])  Discussed with the patient the risk posed by an increased BMI. Discussed importance of portion control, calorie counting and at least 150 minutes of physical activity weekly. Avoid sweet beverages and drink more water. Eat at least 6 servings of fruit and vegetables daily    8. Essential hypertension  - losartan-hydrochlorothiazide (HYZAAR) 50-12.5 MG tablet; Take 1 tablet by mouth daily.  Dispense: 90 tablet; Refill: 1   9. Irritant contact dermatitis due to other agents  - hydrocortisone 2.5 % cream; Apply topically 2 (two) times daily.  Dispense: 30 g; Refill: 0  10. Dry skin  - ammonium lactate (AMLACTIN) 12 % lotion; Apply 1 application topically as needed for dry skin.  Dispense: 500 g; Refill: 3

## 2020-12-20 ENCOUNTER — Encounter: Payer: Self-pay | Admitting: Family Medicine

## 2020-12-20 LAB — CBC WITH DIFFERENTIAL/PLATELET
Absolute Monocytes: 283 cells/uL (ref 200–950)
Basophils Absolute: 29 cells/uL (ref 0–200)
Basophils Relative: 0.7 %
Eosinophils Absolute: 62 cells/uL (ref 15–500)
Eosinophils Relative: 1.5 %
HCT: 33.6 % — ABNORMAL LOW (ref 35.0–45.0)
Hemoglobin: 10.1 g/dL — ABNORMAL LOW (ref 11.7–15.5)
Lymphs Abs: 1087 cells/uL (ref 850–3900)
MCH: 23.9 pg — ABNORMAL LOW (ref 27.0–33.0)
MCHC: 30.1 g/dL — ABNORMAL LOW (ref 32.0–36.0)
MCV: 79.4 fL — ABNORMAL LOW (ref 80.0–100.0)
MPV: 9.8 fL (ref 7.5–12.5)
Monocytes Relative: 6.9 %
Neutro Abs: 2640 cells/uL (ref 1500–7800)
Neutrophils Relative %: 64.4 %
Platelets: 364 10*3/uL (ref 140–400)
RBC: 4.23 10*6/uL (ref 3.80–5.10)
RDW: 15.8 % — ABNORMAL HIGH (ref 11.0–15.0)
Total Lymphocyte: 26.5 %
WBC: 4.1 10*3/uL (ref 3.8–10.8)

## 2020-12-20 LAB — IRON,TIBC AND FERRITIN PANEL
%SAT: 14 % (calc) — ABNORMAL LOW (ref 16–45)
Ferritin: 9 ng/mL — ABNORMAL LOW (ref 16–288)
Iron: 55 ug/dL (ref 45–160)
TIBC: 386 mcg/dL (calc) (ref 250–450)

## 2020-12-20 LAB — B12 AND FOLATE PANEL
Folate: 19.4 ng/mL
Vitamin B-12: 1701 pg/mL — ABNORMAL HIGH (ref 200–1100)

## 2021-01-16 NOTE — Progress Notes (Signed)
Name: Madeline Pitts   MRN: [id number]    DOB: 05/31/1950   Date:01/17/2021       Progress Note  Subjective  Chief Complaint  Foot  HPI  Iron deficiency anemia: HCT was normal 2 years ago and dropped  when checked at the end of July.  She had COVID-19 in early July. She brought hemoccult cards today that were negative., she would like to try resuming vitamins and adding one table of ferrous sulfate daily and rechecking levels in a couple of months   Foot pain: she noticed pain on right lateral foot about 10 days ago, her husband noticed there is a dark spot on the area. She states touch and certain shoes makes it worse. No injury   Dry skin: she would like refill of Lac Hydrin   Patient Active Problem List   Diagnosis Date Noted   Pre-diabetes 08/26/2016   Dyslipidemia 08/26/2016   Fibroid 04/25/2015   BP (high blood pressure) 04/25/2015   Climacteric 09/08/2012   Menopausal symptom 09/08/2012   Adult BMI 30+ 12/16/2010    Past Surgical History:  Procedure Laterality Date   BREAST BIOPSY Right 09/26/2020   stereo biopsy/ coil clip/ path pending   BREAST BIOPSY Right 09/26/2020   stereo biopsy/ coil clip/ path pending   COLONOSCOPY  2009?   Dr Alveta Heimlich   COLONOSCOPY WITH PROPOFOL N/A 10/06/2017   Procedure: COLONOSCOPY WITH PROPOFOL;  Surgeon: Robert Bellow, MD;  Location: [facility] ENDOSCOPY;  Service: Endoscopy;  Laterality: N/A;   ENDOMETRIAL ABLATION      Family History  Problem Relation Age of Onset   Uterine cancer Mother    Gout Father    COPD Father    Emphysema Father        Was a heavy smoker   Lung cancer Brother        smoker   Lung cancer Brother    Breast cancer Neg Hx     Social History   Tobacco Use   Smoking status: Never   Smokeless tobacco: Never  Substance Use Topics   Alcohol use: No    Alcohol/week: 0.0 standard drinks     Current Outpatient Medications:    ammonium lactate (AMLACTIN) 12 % lotion, Apply 1 application topically as  needed for dry skin., Disp: 500 g, Rfl: 3   estrogen, conjugated,-medroxyprogesterone (PREMPRO) 0.45-1.5 MG tablet, Take by mouth., Disp: , Rfl:    Ginseng 250 MG CAPS, Take by mouth., Disp: , Rfl:    GLUCOSAMINE SULFATE-MSM PO, Take 2 tablets by mouth once., Disp: , Rfl:    hydrocortisone 2.5 % cream, Apply topically 2 (two) times daily., Disp: 30 g, Rfl: 0   losartan-hydrochlorothiazide (HYZAAR) 50-12.5 MG tablet, Take 1 tablet by mouth daily., Disp: 90 tablet, Rfl: 1   Multiple Vitamin (MULTI-VITAMINS) TABS, Take by mouth., Disp: , Rfl:    Omega-3 Fatty Acids (FISH OIL) 1000 MG CAPS, Take 1 capsule by mouth 3 (three) times a week., Disp: , Rfl:    TURMERIC PO, Take by mouth., Disp: , Rfl:    vitamin B-12 (CYANOCOBALAMIN) 500 MCG tablet, Take 500 mcg by mouth daily., Disp: , Rfl:    vitamin C (ASCORBIC ACID) 500 MG tablet, Take 500 mg by mouth daily., Disp: , Rfl:    zinc gluconate 50 MG tablet, Take 50 mg by mouth 2 (two) times a week., Disp: , Rfl:   Allergies  Allergen Reactions   Codeine Nausea Only and Other (See Comments)  Makes patient feel lethargic-can't move   Penicillins Nausea Only    I personally reviewed active problem list, medication list, allergies, family history, social history, health maintenance with the patient/caregiver today.   ROS  Ten systems reviewed and is negative except as mentioned in HPI   Objective  Vitals:   01/17/21 1003  BP: 136/70  Pulse: 86  Resp: 16  Temp: 98.4 F (36.9 C)  SpO2: 94%  Weight: 199 lb (90.3 kg)  Height: '5\' 1"'  (1.549 m)    Body mass index is 37.6 kg/m.  Physical Exam  Constitutional: Patient appears well-developed and well-nourished. Obese  No distress.  HEENT: head atraumatic, normocephalic, pupils equal and reactive to light, neck supple Cardiovascular: Normal rate, regular rhythm and normal heart sounds.  No murmur heard. No BLE edema. Pulmonary/Chest: Effort normal and breath sounds normal. No respiratory  distress. Abdominal: Soft.  There is no tenderness. Foot exam: she has a thickening of skin on right lateral foot, about 1 inch below 5 th toe, tender to touch. Psychiatric: Patient has a normal mood and affect. behavior is normal. Judgment and thought content normal.   Recent Results (from the past 2160 hour(s))  Lipid panel     Status: Abnormal   Collection Time: 12/03/20 11:58 AM  Result Value Ref Range   Cholesterol 225 (H) <200 mg/dL   HDL 51 > OR = 50 mg/dL   Triglycerides 139 <150 mg/dL   LDL Cholesterol (Calc) 148 (H) mg/dL (calc)    Comment: Reference range: <100 . Desirable range <100 mg/dL for primary prevention;   <70 mg/dL for patients with CHD or diabetic patients  with > or = 2 CHD risk factors. Marland Kitchen LDL-C is now calculated using the Martin-Hopkins  calculation, which is a validated novel method providing  better accuracy than the Friedewald equation in the  estimation of LDL-C.  Cresenciano Genre et al. Annamaria Helling. 7169;678(93): 2061-2068  (http://education.[facility].com/faq/FAQ164)    Total CHOL/HDL Ratio 4.4 <5.0 (calc)   Non-HDL Cholesterol (Calc) 174 (H) <130 mg/dL (calc)    Comment: For patients with diabetes plus 1 major ASCVD risk  factor, treating to a non-HDL-C goal of <100 mg/dL  (LDL-C of <70 mg/dL) is considered a therapeutic  option.   CBC with Differential/Platelet     Status: Abnormal   Collection Time: 12/03/20 11:58 AM  Result Value Ref Range   WBC 3.7 (L) 3.8 - 10.8 Thousand/uL   RBC 4.21 3.80 - 5.10 Million/uL   Hemoglobin 10.5 (L) 11.7 - 15.5 g/dL   HCT 33.4 (L) 35.0 - 45.0 %   MCV 79.3 (L) 80.0 - 100.0 fL   MCH 24.9 (L) 27.0 - 33.0 pg   MCHC 31.4 (L) 32.0 - 36.0 g/dL   RDW 16.2 (H) 11.0 - 15.0 %   Platelets 350 140 - 400 Thousand/uL   MPV 9.5 7.5 - 12.5 fL   Neutro Abs 2,146 1,500 - 7,800 cells/uL   Lymphs Abs 1,140 850 - 3,900 cells/uL   Absolute Monocytes 307 200 - 950 cells/uL   Eosinophils Absolute 78 15 - 500 cells/uL   Basophils  Absolute 30 0 - 200 cells/uL   Neutrophils Relative % 58 %   Total Lymphocyte 30.8 %   Monocytes Relative 8.3 %   Eosinophils Relative 2.1 %   Basophils Relative 0.8 %  COMPLETE METABOLIC PANEL WITH GFR     Status: None   Collection Time: 12/03/20 11:58 AM  Result Value Ref Range   Glucose, Bld 91 65 -  99 mg/dL    Comment: .            Fasting reference interval .    BUN 15 7 - 25 mg/dL   Creat 0.95 0.50 - 1.05 mg/dL   eGFR 65 > OR = 60 mL/min/1.43m    Comment: The eGFR is based on the CKD-EPI 2021 equation. To calculate  the new eGFR from a previous Creatinine or Cystatin C result, go to https://www.kidney.org/professionals/ kdoqi/gfr%5Fcalculator    BUN/Creatinine Ratio NOT APPLICABLE 6 - 22 (calc)   Sodium 139 135 - 146 mmol/L   Potassium 4.0 3.5 - 5.3 mmol/L   Chloride 101 98 - 110 mmol/L   CO2 29 20 - 32 mmol/L   Calcium 9.8 8.6 - 10.4 mg/dL   Total Protein 6.7 6.1 - 8.1 g/dL   Albumin 4.2 3.6 - 5.1 g/dL   Globulin 2.5 1.9 - 3.7 g/dL (calc)   AG Ratio 1.7 1.0 - 2.5 (calc)   Total Bilirubin 0.5 0.2 - 1.2 mg/dL   Alkaline phosphatase (APISO) 66 37 - 153 U/L   AST 16 10 - 35 U/L   ALT 15 6 - 29 U/L  Hemoglobin A1c     Status: Abnormal   Collection Time: 12/03/20 11:58 AM  Result Value Ref Range   Hgb A1c MFr Bld 6.1 (H) <5.7 % of total Hgb    Comment: For someone without known diabetes, a hemoglobin  A1c value between 5.7% and 6.4% is consistent with prediabetes and should be confirmed with a  follow-up test. . For someone with known diabetes, a value <7% indicates that their diabetes is well controlled. A1c targets should be individualized based on duration of diabetes, age, comorbid conditions, and other considerations. . This assay result is consistent with an increased risk of diabetes. . Currently, no consensus exists regarding use of hemoglobin A1c for diagnosis of diabetes for children. .    Mean Plasma Glucose 128 mg/dL   eAG (mmol/L) 7.1 mmol/L   VITAMIN D 25 Hydroxy (Vit-D Deficiency, Fractures)     Status: None   Collection Time: 12/03/20 11:58 AM  Result Value Ref Range   Vit D, 25-Hydroxy 53 30 - 100 ng/mL    Comment: Vitamin D Status         25-OH Vitamin D: . Deficiency:                    <20 ng/mL Insufficiency:             20 - 29 ng/mL Optimal:                 > or = 30 ng/mL . For 25-OH Vitamin D testing on patients on  D2-supplementation and patients for whom quantitation  of D2 and D3 fractions is required, the [facility](TM) 25-OH VIT D, (D2,D3), LC/MS/MS is recommended: order  code [id number](patients >2107yr. See Note 1 . Note 1 . For additional information, please refer to  http://education.[facility].com/faq/FAQ199  (This link is being provided for informational/ educational purposes only.)   CBC with Differential/Platelet     Status: Abnormal   Collection Time: 12/19/20 11:47 AM  Result Value Ref Range   WBC 4.1 3.8 - 10.8 Thousand/uL   RBC 4.23 3.80 - 5.10 Million/uL   Hemoglobin 10.1 (L) 11.7 - 15.5 g/dL   HCT 33.6 (L) 35.0 - 45.0 %   MCV 79.4 (L) 80.0 - 100.0 fL   MCH 23.9 (L) 27.0 - 33.0 pg   MCHC  30.1 (L) 32.0 - 36.0 g/dL   RDW 15.8 (H) 11.0 - 15.0 %   Platelets 364 140 - 400 Thousand/uL   MPV 9.8 7.5 - 12.5 fL   Neutro Abs 2,640 1,500 - 7,800 cells/uL   Lymphs Abs 1,087 850 - 3,900 cells/uL   Absolute Monocytes 283 200 - 950 cells/uL   Eosinophils Absolute 62 15 - 500 cells/uL   Basophils Absolute 29 0 - 200 cells/uL   Neutrophils Relative % 64.4 %   Total Lymphocyte 26.5 %   Monocytes Relative 6.9 %   Eosinophils Relative 1.5 %   Basophils Relative 0.7 %  Iron, TIBC and Ferritin Panel     Status: Abnormal   Collection Time: 12/19/20 11:47 AM  Result Value Ref Range   Iron 55 45 - 160 mcg/dL   TIBC 386 250 - 450 mcg/dL (calc)   %SAT 14 (L) 16 - 45 % (calc)   Ferritin 9 (L) 16 - 288 ng/mL  B12 and Folate Panel     Status: Abnormal   Collection Time: 12/19/20 11:47 AM  Result  Value Ref Range   Vitamin B-12 1,701 (H) 200 - 1,100 pg/mL   Folate 19.4 ng/mL    Comment:                            Reference Range                            Low:           <3.4                            Borderline:    3.4-5.4                            Normal:        >5.4 .     PHQ2/9: Depression screen [facility] 2/9 01/17/2021 12/19/2020 12/03/2020 05/02/2020 03/28/2019  Decreased Interest 0 0 0 0 0  Down, Depressed, Hopeless 0 0 0 0 0  PHQ - 2 Score 0 0 0 0 0  Altered sleeping - - - - -  Tired, decreased energy - - - - -  Change in appetite - - - - -  Feeling bad or failure about yourself  - - - - -  Trouble concentrating - - - - -  Moving slowly or fidgety/restless - - - - -  Suicidal thoughts - - - - -  PHQ-9 Score - - - - -    phq 9 is negative   Fall Risk: Fall Risk  01/17/2021 12/19/2020 12/03/2020 05/02/2020 03/28/2019  Falls in the past year? 0 0 0 0 0  Number falls in past yr: 0 0 0 0 0  Injury with Fall? 0 0 0 0 0  Risk for fall due to : No Fall Risks No Fall Risks - No Fall Risks -  Follow up Falls prevention discussed Falls prevention discussed - Falls prevention discussed Falls prevention discussed      Functional Status Survey: Is the patient deaf or have difficulty hearing?: No Does the patient have difficulty seeing, even when wearing glasses/contacts?: No Does the patient have difficulty concentrating, remembering, or making decisions?: No Does the patient have difficulty walking or climbing stairs?: No Does the patient have difficulty dressing  or bathing?: No Does the patient have difficulty doing errands alone such as visiting a doctor's office or shopping?: No    Assessment & Plan  1. Plantar wart of right foot  - Ambulatory referral to Podiatry  2. Dry skin  - ammonium lactate (AMLACTIN) 12 % lotion; Apply 1 application topically as needed for dry skin.  Dispense: 400 g; Refill: 3  3. Iron deficiency anemia, unspecified iron deficiency anemia  type  - POC Hemoccult Bld/Stl (3-Cd Home Screen); Future - POC Hemoccult Bld/Stl (3-Cd Home Screen) - CBC with Differential/Platelet - Iron, TIBC and Ferritin Panel   4. Needs flu shot  - Flu vaccine HIGH DOSE PF (Fluzone High dose)

## 2021-01-17 ENCOUNTER — Other Ambulatory Visit: Payer: Self-pay

## 2021-01-17 ENCOUNTER — Encounter: Payer: Self-pay | Admitting: Family Medicine

## 2021-01-17 ENCOUNTER — Ambulatory Visit (INDEPENDENT_AMBULATORY_CARE_PROVIDER_SITE_OTHER): Payer: PPO | Admitting: Family Medicine

## 2021-01-17 VITALS — BP 136/70 | HR 86 | Temp 98.4°F | Resp 16 | Ht 61.0 in | Wt 199.0 lb

## 2021-01-17 DIAGNOSIS — Z23 Encounter for immunization: Secondary | ICD-10-CM | POA: Diagnosis not present

## 2021-01-17 DIAGNOSIS — L853 Xerosis cutis: Secondary | ICD-10-CM

## 2021-01-17 DIAGNOSIS — B070 Plantar wart: Secondary | ICD-10-CM | POA: Diagnosis not present

## 2021-01-17 DIAGNOSIS — D509 Iron deficiency anemia, unspecified: Secondary | ICD-10-CM

## 2021-01-17 LAB — POC HEMOCCULT BLD/STL (HOME/3-CARD/SCREEN)
Card #2 Fecal Occult Blod, POC: NEGATIVE
Card #3 Fecal Occult Blood, POC: NEGATIVE
Fecal Occult Blood, POC: NEGATIVE

## 2021-01-17 MED ORDER — AMMONIUM LACTATE 12 % EX LOTN: 1.0000 "application " | TOPICAL_LOTION | CUTANEOUS | 3 refills | Status: DC | PRN

## 2021-01-17 MED ORDER — IRON (FERROUS SULFATE) 325 (65 FE) MG PO TABS: 325.0000 mg | ORAL_TABLET | Freq: Every day | ORAL | 5 refills | Status: AC

## 2021-01-27 ENCOUNTER — Ambulatory Visit: Payer: PPO | Admitting: Podiatry

## 2021-01-27 ENCOUNTER — Other Ambulatory Visit: Payer: Self-pay

## 2021-01-27 DIAGNOSIS — M79671 Pain in right foot: Secondary | ICD-10-CM

## 2021-01-27 DIAGNOSIS — L989 Disorder of the skin and subcutaneous tissue, unspecified: Secondary | ICD-10-CM | POA: Diagnosis not present

## 2021-01-27 NOTE — Progress Notes (Signed)
   Subjective: 70 y.o. female presenting to the office today as a new patient for evaluation of pain and tenderness to the right plantar foot secondary to a skin lesion.  Patient believes that there may be a splinter in there.  Is been painful for him for about 1 month now.  She presents for further treatment and evaluation   Past Medical History:  Diagnosis Date   Hypertension    Obesity    Prediabetes    Vitamin D deficiency      Objective:  Physical Exam General: Alert and oriented x3 in no acute distress  Dermatology: Hyperkeratotic lesion(s) present on the plantar aspect fifth MTPJ right. Pain on palpation with a central nucleated core noted. Skin is warm, dry and supple bilateral lower extremities. Negative for open lesions or macerations.  Vascular: Palpable pedal pulses bilaterally. No edema or erythema noted. Capillary refill within normal limits.  Neurological: Epicritic and protective threshold grossly intact bilaterally.   Musculoskeletal Exam: Pain on palpation at the keratotic lesion(s) noted. Range of motion within normal limits bilateral. Muscle strength 5/5 in all groups bilateral.  Assessment: 1.  Porokeratosis/possible foreign body plantar fifth MTP joint right   Plan of Care:  1. Patient evaluated 2. Excisional debridement of keratoic lesion(s) using a chisel blade was performed without incident.  3. Dressed area with light dressing and salicylic acid.  Patient felt immediately there was relief when she stood up 4. Patient is to return to the clinic PRN.   Edrick Kins, DPM [facility]  Dr. Edrick Kins, DPM    [address], [place] [address]                Office [phone]  Fax [phone]

## 2021-03-03 ENCOUNTER — Emergency Department (HOSPITAL_COMMUNITY): Payer: PPO

## 2021-03-03 ENCOUNTER — Encounter (HOSPITAL_COMMUNITY): Payer: Self-pay | Admitting: Emergency Medicine

## 2021-03-03 ENCOUNTER — Emergency Department (HOSPITAL_COMMUNITY)
Admission: EM | Admit: 2021-03-03 | Discharge: 2021-03-03 | Disposition: A | Discharge status: Home / Self Care | Payer: PPO | Attending: Emergency Medicine | Admitting: Emergency Medicine

## 2021-03-03 DIAGNOSIS — Z79899 Other long term (current) drug therapy: Secondary | ICD-10-CM | POA: Insufficient documentation

## 2021-03-03 DIAGNOSIS — R112 Nausea with vomiting, unspecified: Secondary | ICD-10-CM | POA: Insufficient documentation

## 2021-03-03 DIAGNOSIS — I10 Essential (primary) hypertension: Secondary | ICD-10-CM | POA: Insufficient documentation

## 2021-03-03 DIAGNOSIS — R42 Dizziness and giddiness: Secondary | ICD-10-CM | POA: Insufficient documentation

## 2021-03-03 DIAGNOSIS — G9389 Other specified disorders of brain: Secondary | ICD-10-CM | POA: Diagnosis not present

## 2021-03-03 DIAGNOSIS — R110 Nausea: Secondary | ICD-10-CM | POA: Diagnosis not present

## 2021-03-03 LAB — CBC WITH DIFFERENTIAL/PLATELET
Abs Immature Granulocytes: 0.03 10*3/uL (ref 0.00–0.07)
Basophils Absolute: 0 10*3/uL (ref 0.0–0.1)
Basophils Relative: 1 %
Eosinophils Absolute: 0 10*3/uL (ref 0.0–0.5)
Eosinophils Relative: 1 %
HCT: 35 % — ABNORMAL LOW (ref 36.0–46.0)
Hemoglobin: 10.9 g/dL — ABNORMAL LOW (ref 12.0–15.0)
Immature Granulocytes: 1 %
Lymphocytes Relative: 16 %
Lymphs Abs: 1 10*3/uL (ref 0.7–4.0)
MCH: 24.9 pg — ABNORMAL LOW (ref 26.0–34.0)
MCHC: 31.1 g/dL (ref 30.0–36.0)
MCV: 80.1 fL (ref 80.0–100.0)
Monocytes Absolute: 0.3 10*3/uL (ref 0.1–1.0)
Monocytes Relative: 4 %
Neutro Abs: 4.9 10*3/uL (ref 1.7–7.7)
Neutrophils Relative %: 77 %
Platelets: 281 10*3/uL (ref 150–400)
RBC: 4.37 MIL/uL (ref 3.87–5.11)
RDW: 20.2 % — ABNORMAL HIGH (ref 11.5–15.5)
WBC: 6.2 10*3/uL (ref 4.0–10.5)
nRBC: 0 % (ref 0.0–0.2)

## 2021-03-03 LAB — BASIC METABOLIC PANEL
Anion gap: 6 (ref 5–15)
BUN: 18 mg/dL (ref 8–23)
CO2: 28 mmol/L (ref 22–32)
Calcium: 8.9 mg/dL (ref 8.9–10.3)
Chloride: 105 mmol/L (ref 98–111)
Creatinine, Ser: 0.96 mg/dL (ref 0.44–1.00)
GFR, Estimated: >60 mL/min (ref >60)
Glucose, Bld: 151 mg/dL — ABNORMAL HIGH (ref 70–99)
Potassium: 3.5 mmol/L (ref 3.5–5.1)
Sodium: 139 mmol/L (ref 135–145)

## 2021-03-03 MED ORDER — ONDANSETRON HCL 4 MG/2ML IJ SOLN
4.0000 mg | Freq: Once | INTRAMUSCULAR | Status: AC
  Administered 2021-03-03: 4 mg via INTRAVENOUS
  Filled 2021-03-03: qty 2

## 2021-03-03 MED ORDER — MECLIZINE HCL 25 MG PO TABS: 25.0000 mg | ORAL_TABLET | Freq: Three times a day (TID) | ORAL | 0 refills | Status: DC | PRN

## 2021-03-03 MED ORDER — ONDANSETRON HCL 4 MG PO TABS: 4.0000 mg | ORAL_TABLET | Freq: Four times a day (QID) | ORAL | 0 refills | Status: DC

## 2021-03-03 MED ORDER — DIAZEPAM 5 MG/ML IJ SOLN
2.5000 mg | Freq: Once | INTRAMUSCULAR | Status: AC
  Administered 2021-03-03: 2.5 mg via INTRAVENOUS
  Filled 2021-03-03: qty 2

## 2021-03-03 MED ORDER — SODIUM CHLORIDE 0.9 % IV BOLUS
1000.0000 mL | Freq: Once | INTRAVENOUS | Status: AC
  Administered 2021-03-03: 1000 mL via INTRAVENOUS

## 2021-03-03 NOTE — ED Triage Notes (Signed)
Per EMS, patient from home, room spinning upon waking this morning. Dizziness and nausea worsens with movement. Symptoms improve when flat.  BP 158/84 HR 60 RR 16 O2 96%  CBG 171

## 2021-03-03 NOTE — ED Provider Notes (Signed)
[place] DEPT Provider Note   CSN: [id number] Arrival date & time: 03/03/21  9470     History Chief Complaint  Patient presents with   Dizziness    Madeline Pitts is a 70 y.o. female.  The history is provided by the patient.  Dizziness Quality:  Head spinning Severity:  Severe Onset quality:  Sudden Duration:  1 hour Timing:  Constant Progression:  Improving Chronicity:  New Context: head movement and standing up   Relieved by:  Being still Worsened by:  Standing up and turning head Associated symptoms: nausea and vomiting   Associated symptoms: no blood in stool, no chest pain, no diarrhea, no headaches, no hearing loss, no palpitations, no shortness of breath, no syncope, no tinnitus, no vision changes and no weakness   Risk factors: no hx of vertigo and no new medications       Past Medical History:  Diagnosis Date   Hypertension    Obesity    Prediabetes    Vitamin D deficiency     Patient Active Problem List   Diagnosis Date Noted   Pre-diabetes 08/26/2016   Dyslipidemia 08/26/2016   Fibroid 04/25/2015   BP (high blood pressure) 04/25/2015   Climacteric 09/08/2012   Menopausal symptom 09/08/2012   Adult BMI 30+ 12/16/2010    Past Surgical History:  Procedure Laterality Date   BREAST BIOPSY Right 09/26/2020   stereo biopsy/ coil clip/ path pending   BREAST BIOPSY Right 09/26/2020   stereo biopsy/ coil clip/ path pending   COLONOSCOPY  2009?   Dr Alveta Heimlich   COLONOSCOPY WITH PROPOFOL N/A 10/06/2017   Procedure: COLONOSCOPY WITH PROPOFOL;  Surgeon: Robert Bellow, MD;  Location: [facility] ENDOSCOPY;  Service: Endoscopy;  Laterality: N/A;   ENDOMETRIAL ABLATION       OB History     Gravida  3   Para  2   Term      Preterm      AB      Living  2      SAB      IAB      Ectopic      Multiple      Live Births           Obstetric Comments  1st Menstrual Cycle:  74 1st Pregnancy:  23           Family History  Problem Relation Age of Onset   Uterine cancer Mother    Gout Father    COPD Father    Emphysema Father        Was a heavy smoker   Lung cancer Brother        smoker   Lung cancer Brother    Breast cancer Neg Hx     Social History   Tobacco Use   Smoking status: Never   Smokeless tobacco: Never  Vaping Use   Vaping Use: Never used  Substance Use Topics   Alcohol use: No    Alcohol/week: 0.0 standard drinks   Drug use: No    Home Medications Prior to Admission medications   Medication Sig Start Date End Date Taking? Authorizing Provider  meclizine (ANTIVERT) 25 MG tablet Take 1 tablet (25 mg total) by mouth 3 (three) times daily as needed for dizziness. 03/03/21  Yes Hayli Milligan, DO  ondansetron (ZOFRAN) 4 MG tablet Take 1 tablet (4 mg total) by mouth every 6 (six) hours. 03/03/21  Yes Jermall Isaacson, DO  ammonium lactate (  AMLACTIN) 12 % lotion Apply 1 application topically as needed for dry skin. 01/17/21   Steele Sizer, MD  estrogen, conjugated,-medroxyprogesterone (PREMPRO) 0.45-1.5 MG tablet Take by mouth. 04/12/15   [provider]  Ginseng 250 MG CAPS Take by mouth.    [provider]  GLUCOSAMINE SULFATE-MSM PO Take 2 tablets by mouth once.    [provider]  hydrocortisone 2.5 % cream Apply topically 2 (two) times daily. 12/19/20   Steele Sizer, MD  Iron, Ferrous Sulfate, 325 (65 Fe) MG TABS Take 325 mg by mouth daily. 01/17/21   Steele Sizer, MD  losartan-hydrochlorothiazide (HYZAAR) 50-12.5 MG tablet Take 1 tablet by mouth daily. 12/19/20   Steele Sizer, MD  MODERNA COVID-19 VACCINE 100 MCG/0.5ML injection  12/21/20   [provider]  Multiple Vitamin (MULTI-VITAMINS) TABS Take by mouth.    [provider]  Omega-3 Fatty Acids (FISH OIL) 1000 MG CAPS Take 1 capsule by mouth 3 (three) times a week.    [provider]  TURMERIC PO Take by mouth.    [provider]  vitamin  B-12 (CYANOCOBALAMIN) 500 MCG tablet Take 500 mcg by mouth daily.    [provider]  vitamin C (ASCORBIC ACID) 500 MG tablet Take 500 mg by mouth daily.    [provider]  zinc gluconate 50 MG tablet Take 50 mg by mouth 2 (two) times a week.    [provider]    Allergies    Codeine and Penicillins  Review of Systems   Review of Systems  Constitutional:  Negative for chills and fever.  HENT:  Negative for ear pain, hearing loss, sore throat and tinnitus.   Eyes:  Negative for pain and visual disturbance.  Respiratory:  Negative for cough and shortness of breath.   Cardiovascular:  Negative for chest pain, palpitations and syncope.  Gastrointestinal:  Positive for nausea and vomiting. Negative for abdominal pain, blood in stool and diarrhea.  Genitourinary:  Negative for dysuria and hematuria.  Musculoskeletal:  Negative for arthralgias and back pain.  Skin:  Negative for color change and rash.  Neurological:  Positive for dizziness. Negative for tremors, seizures, syncope, facial asymmetry, speech difficulty, weakness, light-headedness, numbness and headaches.  All other systems reviewed and are negative.  Physical Exam Updated Vital Signs BP (!) 141/46   Pulse 73   Temp 97.6 F (36.4 C)   Resp 19   Ht 5\' 2"  (1.575 m)   Wt 87.5 kg   SpO2 97%   BMI 35.30 kg/m   Physical Exam Vitals and nursing note reviewed.  Constitutional:      General: She is not in acute distress.    Appearance: She is well-developed. She is not ill-appearing.  HENT:     Head: Normocephalic and atraumatic.     Nose: Nose normal.     Mouth/Throat:     Mouth: Mucous membranes are moist.  Eyes:     Extraocular Movements: Extraocular movements intact.     Conjunctiva/sclera: Conjunctivae normal.     Pupils: Pupils are equal, round, and reactive to light.  Cardiovascular:     Rate and Rhythm: Normal rate and regular rhythm.     Pulses: Normal pulses.     Heart sounds:  Normal heart sounds. No murmur heard. Pulmonary:     Effort: Pulmonary effort is normal. No respiratory distress.     Breath sounds: Normal breath sounds.  Abdominal:     Palpations: Abdomen is soft.  Tenderness: There is no abdominal tenderness.  Musculoskeletal:     Cervical back: Normal range of motion and neck supple.  Skin:    General: Skin is warm and dry.  Neurological:     General: No focal deficit present.     Mental Status: She is alert and oriented to person, place, and time.     Cranial Nerves: No cranial nerve deficit.     Sensory: No sensory deficit.     Motor: No weakness.     Coordination: Coordination normal.     Comments: 5+ out of 5 strength throughout, normal sensation, normal speech, normal visual fields, normal heel-to-shin, normal finger-to-nose finger, unable to test gait because when patient stands she becomes dizzy and nauseous, no obvious nystagmus    ED Results / Procedures / Treatments   Labs (all labs ordered are listed, but only abnormal results are displayed) Labs Reviewed  CBC WITH DIFFERENTIAL/PLATELET - Abnormal; Notable for the following components:      Result Value   Hemoglobin 10.9 (*)    HCT 35.0 (*)    MCH 24.9 (*)    RDW 20.2 (*)    All other components within normal limits  BASIC METABOLIC PANEL - Abnormal; Notable for the following components:   Glucose, Bld 151 (*)    All other components within normal limits    EKG EKG Interpretation  Date/Time:  Monday March 03 2021 09:33:17 EDT Ventricular Rate:  54 PR Interval:  159 QRS Duration: 84 QT Interval:  425 QTC Calculation: 403 R Axis:   24 Text Interpretation: Sinus rhythm Abnormal R-wave progression, early transition Confirmed by Lennice Sites ([id number]) on 03/03/2021 9:50:09 AM  Radiology CT Head Wo Contrast  Result Date: 03/03/2021 CLINICAL DATA:  Dizziness. EXAM: CT HEAD WITHOUT CONTRAST TECHNIQUE: Contiguous axial images were obtained from the base of the skull  through the vertex without intravenous contrast. COMPARISON:  None. FINDINGS: Brain: No evidence of acute infarction, hemorrhage, hydrocephalus, extra-axial collection or mass lesion/mass effect. Vascular: No hyperdense vessel or unexpected calcification. Skull: Normal. Negative for fracture or focal lesion. Sinuses/Orbits: No acute finding. Other: None. IMPRESSION: No acute intracranial abnormality seen. Electronically Signed   By: Marijo Conception M.D.   On: 03/03/2021 09:50   MR BRAIN WO CONTRAST  Result Date: 03/03/2021 CLINICAL DATA:  Neuro deficit, acute, stroke suspected. Rule out stroke. Dizziness. Additional history provided: Patient reports dizziness, nausea, symptoms improved when laying flat. EXAM: MRI HEAD WITHOUT CONTRAST TECHNIQUE: Multiplanar, multiecho pulse sequences of the brain and surrounding structures were obtained without intravenous contrast. COMPARISON:  Head CT 03/03/2021. FINDINGS: Brain: Cerebral volume is normal for age. Mild multifocal T2 FLAIR hyperintense signal abnormality within the cerebral white matter, nonspecific but compatible with chronic small vessel ischemic disease. There is no acute infarct. No evidence of an intracranial mass. No chronic intracranial blood products. No extra-axial fluid collection. No midline shift. Vascular: Maintained flow voids within the proximal large arterial vessels. Skull and upper cervical spine: No focal suspicious marrow lesion. Sinuses/Orbits: Visualized orbits show no acute finding. Trace mucosal thickening within the bilateral ethmoid air cells. Other: 11 mm T2 hyperintense lesion within the left parotid gland suspicious for primary parotid neoplasm (series 11, image 1) (series 15, image 20). IMPRESSION: No evidence of acute intracranial abnormality. Mild chronic small vessel ischemic changes within the cerebral white matter. 11 mm T2 hyperintense lesion within the left parotid gland suspicious for primary parotid neoplasm. Non-emergent  ENT consultation recommended. Minimal mucosal thickening within the  bilateral ethmoid sinuses. Electronically Signed   By: Kellie Simmering D.O.   On: 03/03/2021 13:17    Procedures Procedures   Medications Ordered in ED Medications  sodium chloride 0.9 % bolus 1,000 mL (1,000 mLs Intravenous Bolus 03/03/21 0937)  ondansetron (ZOFRAN) injection 4 mg (4 mg Intravenous Given 03/03/21 0937)  diazepam (VALIUM) injection 2.5 mg (2.5 mg Intravenous Given 03/03/21 9371)    ED Course  I have reviewed the triage vital signs and the nursing notes.  Pertinent labs & imaging results that were available during my care of the patient were reviewed by me and considered in my medical decision making (see chart for details).    MDM Rules/Calculators/A&P                           Madeline Pitts is here with dizziness.  History of hypertension.  Unremarkable vitals.  No fever.  Woke up with dizziness that started suddenly with nausea and vomiting.  Worse with head movement and standing up.  Feels better when she is lying still.  Neurologically she is intact.  Has normal finger-to-nose finger, normal speech, normal vision.  Very symptomatic when she stands up and unable to ambulate as she becomes nauseous.  Seems more likely to be a peripheral vertigo but given her symptoms we will get a head CT.  We will give Valium, Zofran, fluid bolus and check basic labs.  EKG shows sinus rhythm.  No arrhythmias or ischemic changes.  I have a lower suspicion for stroke given her exam however may pursue MRI if she still fairly symptomatic.  Lab work overall unremarkable.  Head CT unremarkable.  Still fairly symptomatic can proceed MRI.  MRI shows no evidence of stroke however did show an 11 mm T2 hyperdense lesion within the left parotid gland suspicious for primary parotid neoplasm.  She was made aware of this.  Not sure if this is contributing to her symptoms but we will have her follow-up with ENT for her suspected  vertigo and for this lesion within the parotid gland.  Given a prescription for meclizine and Zofran.  Understands return precautions.  Discharged in good condition.  This chart was dictated using voice recognition software.  Despite best efforts to proofread,  errors can occur which can change the documentation meaning.    Final Clinical Impression(s) / ED Diagnoses Final diagnoses:  Dizziness    Rx / DC Orders ED Discharge Orders          Ordered    meclizine (ANTIVERT) 25 MG tablet  3 times daily PRN        03/03/21 1358    ondansetron (ZOFRAN) 4 MG tablet  Every 6 hours        03/03/21 1358             CuratoloQuita Skye, DO 03/03/21 1402

## 2021-03-03 NOTE — Discharge Instructions (Signed)
Recommend taking meclizine as needed for dizziness.  This medication is slightly sedating so please be careful taking this medicine.  Do not mix with any other alcohol or drugs.  Take Zofran as needed for nausea.  Follow-up with ear nose and throat doctor.  You will need follow-up for your dizziness as well as possible parotid mass as discussed.  Follow-up with your primary care doctor as well.

## 2021-03-04 ENCOUNTER — Telehealth: Payer: Self-pay

## 2021-03-04 ENCOUNTER — Other Ambulatory Visit: Payer: Self-pay | Admitting: Family Medicine

## 2021-03-04 DIAGNOSIS — R42 Dizziness and giddiness: Secondary | ICD-10-CM

## 2021-03-04 NOTE — Telephone Encounter (Signed)
Called pt and informed her that Dr. Ancil Boozer reviewed the notes from the ER and based on the results patient has I reviewed the records and based on results and notes she has Benign paroxysmal vertigo and to see will refer patient ENT for Epley maneuver. Pt verbalized understanding.

## 2021-03-04 NOTE — Telephone Encounter (Signed)
Copied from [place] [id number]. Topic: General - Inquiry >> Mar 03, 2021  4:38 PM Loma [place] wrote: Pt request a call back from Dr Ancil Boozer went to ER @  [facility] today and was diagnosed with vertigo and told to get a referral from her PCP. Pt wants a fu call from Dr to discuss, pt was very adamant that she talk to Dr Ancil Boozer rather a visit.  States she will not do anything without her imput it is very important FU at [phone] or [phone].

## 2021-03-05 ENCOUNTER — Telehealth: Payer: Self-pay

## 2021-03-05 NOTE — Progress Notes (Signed)
Name: Madeline Pitts   MRN: [id number]    DOB: 1950/10/03   Date:03/05/2021       Progress Note  Subjective  Chief Complaint  Discuss Referral- Otolaryngologist   I connected with  Phylis Bougie Theisen on 03/05/21 at 12:00 PM EDT by telephone and verified that I am speaking with the correct person using two identifiers.  I discussed the limitations, risks, security and privacy concerns of performing an evaluation and management service by telephone and the availability of in person appointments. Staff also discussed with the patient that there may be a patient responsible charge related to this service. Patient agreed on having a virtual visit  Patient Location: at home  Provider Location: [facility] Additional Individuals present: alone   HPI  Vertigo: she woke up and when she roll on her bed she developed spinning sensation, it improved when she closed her eyes and was still but worse with head movements. She felt diaphoretic and nauseated, she has a son with special needs and her husband had left for work. She called 911 because symptoms were not resolving. She had a negative CT brain but MRI showed a left parotid mass. Since she went home she has been taking Meclizine and zofran, she states symptoms of vertigo resolved and she has been weaning self off medication, but concerned about the parotid mass.     Patient Active Problem List   Diagnosis Date Noted   Pre-diabetes 08/26/2016   Dyslipidemia 08/26/2016   Fibroid 04/25/2015   BP (high blood pressure) 04/25/2015   Climacteric 09/08/2012   Menopausal symptom 09/08/2012   Adult BMI 30+ 12/16/2010    Past Surgical History:  Procedure Laterality Date   BREAST BIOPSY Right 09/26/2020   stereo biopsy/ coil clip/ path pending   BREAST BIOPSY Right 09/26/2020   stereo biopsy/ coil clip/ path pending   COLONOSCOPY  2009?   Dr Alveta Heimlich   COLONOSCOPY WITH PROPOFOL N/A 10/06/2017   Procedure: COLONOSCOPY WITH PROPOFOL;  Surgeon:  Robert Bellow, MD;  Location: [facility] ENDOSCOPY;  Service: Endoscopy;  Laterality: N/A;   ENDOMETRIAL ABLATION      Family History  Problem Relation Age of Onset   Uterine cancer Mother    Gout Father    COPD Father    Emphysema Father        Was a heavy smoker   Lung cancer Brother        smoker   Lung cancer Brother    Breast cancer Neg Hx     Social History   Socioeconomic History   Marital status: Married    Spouse name: Karle Starch    Number of children: 2   Years of education: Not on file   Highest education level: Bachelor's degree (e.g., BA, AB, BS)  Occupational History   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   Tobacco Use   Smoking status: Never   Smokeless tobacco: Never  Vaping Use   Vaping Use: Never used  Substance and Sexual Activity   Alcohol use: No    Alcohol/week: 0.0 standard drinks   Drug use: No   Sexual activity: Yes    Partners: Male    Birth control/protection: Post-menopausal  Other Topics Concern   Not on file  Social History Narrative   Not on file   Social Determinants of Health   Financial Resource Strain: Low Risk    Difficulty of Paying Living Expenses: Not hard at all  Food Insecurity: No Food Insecurity  Worried About Charity fundraiser in the Last Year: Never true   [place] in the Last Year: Never true  Transportation Needs: No Transportation Needs   Lack of Transportation (Medical): No   Lack of Transportation (Non-Medical): No  Physical Activity: Sufficiently Active   Days of Exercise per Week: 4 days   Minutes of Exercise per Session: 40 min  Stress: No Stress Concern Present   Feeling of Stress : Not at all  Social Connections: Socially Integrated   Frequency of Communication with Friends and Family: More than three times a week   Frequency of Social Gatherings with Friends and Family: Once a week   Attends Religious Services: More than 4 times per year   Active Member of [employer] or Organizations: Yes    Attends Music therapist: More than 4 times per year   Marital Status: Married  Human resources officer Violence: Not At Risk   Fear of Current or Ex-Partner: No   Emotionally Abused: No   Physically Abused: No   Sexually Abused: No     Current Outpatient Medications:    ammonium lactate (AMLACTIN) 12 % lotion, Apply 1 application topically as needed for dry skin., Disp: 400 g, Rfl: 3   estrogen, conjugated,-medroxyprogesterone (PREMPRO) 0.45-1.5 MG tablet, Take by mouth., Disp: , Rfl:    Ginseng 250 MG CAPS, Take by mouth., Disp: , Rfl:    GLUCOSAMINE SULFATE-MSM PO, Take 2 tablets by mouth once., Disp: , Rfl:    hydrocortisone 2.5 % cream, Apply topically 2 (two) times daily., Disp: 30 g, Rfl: 0   Iron, Ferrous Sulfate, 325 (65 Fe) MG TABS, Take 325 mg by mouth daily., Disp: 30 tablet, Rfl: 5   losartan-hydrochlorothiazide (HYZAAR) 50-12.5 MG tablet, Take 1 tablet by mouth daily., Disp: 90 tablet, Rfl: 1   meclizine (ANTIVERT) 25 MG tablet, Take 1 tablet (25 mg total) by mouth 3 (three) times daily as needed for dizziness., Disp: 30 tablet, Rfl: 0   MODERNA COVID-19 VACCINE 100 MCG/0.5ML injection, , Disp: , Rfl:    Multiple Vitamin (MULTI-VITAMINS) TABS, Take by mouth., Disp: , Rfl:    Omega-3 Fatty Acids (FISH OIL) 1000 MG CAPS, Take 1 capsule by mouth 3 (three) times a week., Disp: , Rfl:    ondansetron (ZOFRAN) 4 MG tablet, Take 1 tablet (4 mg total) by mouth every 6 (six) hours., Disp: 12 tablet, Rfl: 0   TURMERIC PO, Take by mouth., Disp: , Rfl:    vitamin B-12 (CYANOCOBALAMIN) 500 MCG tablet, Take 500 mcg by mouth daily., Disp: , Rfl:    vitamin C (ASCORBIC ACID) 500 MG tablet, Take 500 mg by mouth daily., Disp: , Rfl:    zinc gluconate 50 MG tablet, Take 50 mg by mouth 2 (two) times a week., Disp: , Rfl:   Allergies  Allergen Reactions   Codeine Nausea Only and Other (See Comments)    Makes patient feel lethargic-can't move   Penicillins Nausea Only    I  personally reviewed active problem list, medication list, allergies, family history, social history, health maintenance with the patient/caregiver today.   ROS  Ten systems reviewed and is negative except as mentioned in HPI   Objective  Virtual encounter, vitals not obtained.  There is no height or weight on file to calculate BMI.  Physical Exam  Awake, alert and oriented   Results for orders placed or performed during the hospital encounter of 03/03/21 (from the past 72 hour(s))  CBC with  Differential     Status: Abnormal   Collection Time: 03/03/21  9:06 AM  Result Value Ref Range   WBC 6.2 4.0 - 10.5 K/uL   RBC 4.37 3.87 - 5.11 MIL/uL   Hemoglobin 10.9 (L) 12.0 - 15.0 g/dL   HCT 35.0 (L) 36.0 - 46.0 %   MCV 80.1 80.0 - 100.0 fL   MCH 24.9 (L) 26.0 - 34.0 pg   MCHC 31.1 30.0 - 36.0 g/dL   RDW 20.2 (H) 11.5 - 15.5 %   Platelets 281 150 - 400 K/uL   nRBC 0.0 0.0 - 0.2 %   Neutrophils Relative % 77 %   Neutro Abs 4.9 1.7 - 7.7 K/uL   Lymphocytes Relative 16 %   Lymphs Abs 1.0 0.7 - 4.0 K/uL   Monocytes Relative 4 %   Monocytes Absolute 0.3 0.1 - 1.0 K/uL   Eosinophils Relative 1 %   Eosinophils Absolute 0.0 0.0 - 0.5 K/uL   Basophils Relative 1 %   Basophils Absolute 0.0 0.0 - 0.1 K/uL   Immature Granulocytes 1 %   Abs Immature Granulocytes 0.03 0.00 - 0.07 K/uL    Comment: Performed at [facility], [place] [address] [place], [place] [address]  Basic metabolic panel     Status: Abnormal   Collection Time: 03/03/21  9:06 AM  Result Value Ref Range   Sodium 139 135 - 145 mmol/L   Potassium 3.5 3.5 - 5.1 mmol/L   Chloride 105 98 - 111 mmol/L   CO2 28 22 - 32 mmol/L   Glucose, Bld 151 (H) 70 - 99 mg/dL    Comment: Glucose reference range applies only to samples taken after fasting for at least 8 hours.   BUN 18 8 - 23 mg/dL   Creatinine, Ser 0.96 0.44 - 1.00 mg/dL   Calcium 8.9 8.9 - 10.3 mg/dL   GFR, Estimated >60 >60 mL/min    Comment:  (NOTE) Calculated using the CKD-EPI Creatinine Equation (2021)    Anion gap 6 5 - 15    Comment: Performed at [facility], [place] [address] [place], [place] [address]    PHQ2/9: Depression screen [facility] 2/9 01/17/2021 12/19/2020 12/03/2020 05/02/2020 03/28/2019  Decreased Interest 0 0 0 0 0  Down, Depressed, Hopeless 0 0 0 0 0  PHQ - 2 Score 0 0 0 0 0  Altered sleeping - - - - -  Tired, decreased energy - - - - -  Change in appetite - - - - -  Feeling bad or failure about yourself  - - - - -  Trouble concentrating - - - - -  Moving slowly or fidgety/restless - - - - -  Suicidal thoughts - - - - -  PHQ-9 Score - - - - -   PHQ-2/9 Result is negative.    Fall Risk: Fall Risk  01/17/2021 12/19/2020 12/03/2020 05/02/2020 03/28/2019  Falls in the past year? 0 0 0 0 0  Number falls in past yr: 0 0 0 0 0  Injury with Fall? 0 0 0 0 0  Risk for fall due to : No Fall Risks No Fall Risks - No Fall Risks -  Follow up Falls prevention discussed Falls prevention discussed - Falls prevention discussed Falls prevention discussed     Assessment & Plan  1. Enlarged parotid gland  - Ambulatory referral to ENT  2. BPV (benign positional vertigo), unspecified laterality  - Ambulatory referral to ENT   I  discussed the assessment and treatment plan with the patient. The patient was provided an opportunity to ask questions and all were answered. The patient agreed with the plan and demonstrated an understanding of the instructions.   The patient was advised to call back or seek an in-person evaluation if the symptoms worsen or if the condition fails to improve as anticipated.  I provided 15  minutes of non-face-to-face time during this encounter.  Steele Sizer, MD

## 2021-03-05 NOTE — Telephone Encounter (Signed)
Pt has called back and does want to do a phone appt tomorrow. She states she may be reached at [phone].

## 2021-03-05 NOTE — Telephone Encounter (Signed)
Copied from [place] [id number]. Topic: General - Other >> Mar 05, 2021  9:43 AM Tessa Lerner A wrote: Reason for CRM: The patient would like to be contacted to discuss their referral to an Otolaryngologist  The patient has additional questions but declined to share them with anyone other than their PCP

## 2021-03-06 ENCOUNTER — Encounter: Payer: Self-pay | Admitting: Family Medicine

## 2021-03-06 ENCOUNTER — Telehealth (INDEPENDENT_AMBULATORY_CARE_PROVIDER_SITE_OTHER): Payer: PPO | Admitting: Family Medicine

## 2021-03-06 VITALS — Ht 62.0 in | Wt 193.0 lb

## 2021-03-06 DIAGNOSIS — H8110 Benign paroxysmal vertigo, unspecified ear: Secondary | ICD-10-CM | POA: Diagnosis not present

## 2021-03-06 DIAGNOSIS — K111 Hypertrophy of salivary gland: Secondary | ICD-10-CM

## 2021-03-25 ENCOUNTER — Telehealth: Payer: Self-pay | Admitting: Family Medicine

## 2021-03-25 NOTE — Telephone Encounter (Signed)
Patient clled states referral to ENT needs to have paratoid mass include=d with the vertigo before her appt on 12/20. Please call back

## 2021-04-01 ENCOUNTER — Ambulatory Visit
Admission: RE | Admit: 2021-04-01 | Discharge: 2021-04-01 | Disposition: A | Discharge status: Home / Self Care | Payer: PPO | Source: Ambulatory Visit | Attending: Family Medicine | Admitting: Family Medicine

## 2021-04-01 ENCOUNTER — Other Ambulatory Visit: Payer: Self-pay

## 2021-04-01 DIAGNOSIS — R928 Other abnormal and inconclusive findings on diagnostic imaging of breast: Secondary | ICD-10-CM

## 2021-04-01 DIAGNOSIS — R922 Inconclusive mammogram: Secondary | ICD-10-CM | POA: Diagnosis not present

## 2021-04-01 DIAGNOSIS — N6489 Other specified disorders of breast: Secondary | ICD-10-CM | POA: Diagnosis not present

## 2021-04-29 ENCOUNTER — Other Ambulatory Visit: Payer: Self-pay | Admitting: Otolaryngology

## 2021-04-29 DIAGNOSIS — D110 Benign neoplasm of parotid gland: Secondary | ICD-10-CM

## 2021-04-29 DIAGNOSIS — D37030 Neoplasm of uncertain behavior of the parotid salivary glands: Secondary | ICD-10-CM | POA: Diagnosis not present

## 2021-04-29 DIAGNOSIS — R42 Dizziness and giddiness: Secondary | ICD-10-CM | POA: Diagnosis not present

## 2021-05-08 ENCOUNTER — Ambulatory Visit: Payer: PPO

## 2021-05-11 HISTORY — PX: BREAST BIOPSY: SHX20

## 2021-05-16 ENCOUNTER — Ambulatory Visit: Payer: PPO

## 2021-05-22 ENCOUNTER — Ambulatory Visit
Admission: RE | Admit: 2021-05-22 | Discharge: 2021-05-22 | Disposition: A | Discharge status: Home / Self Care | Payer: PPO | Source: Ambulatory Visit | Attending: Otolaryngology | Admitting: Otolaryngology

## 2021-05-22 DIAGNOSIS — D110 Benign neoplasm of parotid gland: Secondary | ICD-10-CM

## 2021-05-22 DIAGNOSIS — R221 Localized swelling, mass and lump, neck: Secondary | ICD-10-CM | POA: Diagnosis not present

## 2021-05-26 ENCOUNTER — Other Ambulatory Visit: Payer: Self-pay | Admitting: Otolaryngology

## 2021-05-26 DIAGNOSIS — R591 Generalized enlarged lymph nodes: Secondary | ICD-10-CM

## 2021-06-04 DIAGNOSIS — D37030 Neoplasm of uncertain behavior of the parotid salivary glands: Secondary | ICD-10-CM | POA: Diagnosis not present

## 2021-06-04 DIAGNOSIS — H6123 Impacted cerumen, bilateral: Secondary | ICD-10-CM | POA: Diagnosis not present

## 2021-06-05 DIAGNOSIS — Z124 Encounter for screening for malignant neoplasm of cervix: Secondary | ICD-10-CM | POA: Diagnosis not present

## 2021-06-05 DIAGNOSIS — N951 Menopausal and female climacteric states: Secondary | ICD-10-CM | POA: Diagnosis not present

## 2021-06-05 DIAGNOSIS — Z79890 Hormone replacement therapy (postmenopausal): Secondary | ICD-10-CM | POA: Diagnosis not present

## 2021-06-05 DIAGNOSIS — D219 Benign neoplasm of connective and other soft tissue, unspecified: Secondary | ICD-10-CM | POA: Diagnosis not present

## 2021-06-05 DIAGNOSIS — Z01419 Encounter for gynecological examination (general) (routine) without abnormal findings: Secondary | ICD-10-CM | POA: Diagnosis not present

## 2021-06-05 DIAGNOSIS — R87612 Low grade squamous intraepithelial lesion on cytologic smear of cervix (LGSIL): Secondary | ICD-10-CM | POA: Diagnosis not present

## 2021-06-12 ENCOUNTER — Ambulatory Visit (INDEPENDENT_AMBULATORY_CARE_PROVIDER_SITE_OTHER): Payer: PPO

## 2021-06-12 DIAGNOSIS — Z0000 Encounter for general adult medical examination without abnormal findings: Secondary | ICD-10-CM | POA: Diagnosis not present

## 2021-06-12 NOTE — Patient Instructions (Signed)
[name] , Thank you for taking time to come for your Medicare Wellness Visit. I appreciate your ongoing commitment to your health goals. Please review the following plan we discussed and let me know if I can assist you in the future.   Screening recommendations/referrals: Colonoscopy: done 10/06/17; repeat 09/2022 Mammogram: done 09/04/20 Bone Density: done 03/28/19 Recommended yearly ophthalmology/optometry visit for glaucoma screening and checkup Recommended yearly dental visit for hygiene and checkup  Vaccinations: Influenza vaccine: done 01/17/21 Pneumococcal vaccine: done 12/03/20 Tdap vaccine: done 03/14/12 Shingles vaccine: Shingrix discussed. Please contact your pharmacy for coverage information.  Covid-19: done 06/09/19, 07/07/19, 03/15/20, 12/21/20 & 04/01/21  Advanced directives: Advance directive discussed with you today. Even though you declined this today please call our office should you change your mind and we can give you the proper paperwork for you to fill out.   Conditions/risks identified: Recommend healthy eating and physical activity for desired weight loss  Next appointment: Follow up in one year for your annual wellness visit    Preventive Care 65 Years and Older, Female Preventive care refers to lifestyle choices and visits with your health care provider that can promote health and wellness. What does preventive care include? A yearly physical exam. This is also called an annual well check. Dental exams once or twice a year. Routine eye exams. Ask your health care provider how often you should have your eyes checked. Personal lifestyle choices, including: Daily care of your teeth and gums. Regular physical activity. Eating a healthy diet. Avoiding tobacco and drug use. Limiting alcohol use. Practicing safe sex. Taking low-dose aspirin every day. Taking vitamin and mineral supplements as recommended by your health care provider. What happens during an  annual well check? The services and screenings done by your health care provider during your annual well check will depend on your age, overall health, lifestyle risk factors, and family history of disease. Counseling  Your health care provider may ask you questions about your: Alcohol use. Tobacco use. Drug use. Emotional well-being. Home and relationship well-being. Sexual activity. Eating habits. History of falls. Memory and ability to understand (cognition). Work and work Statistician. Reproductive health. Screening  You may have the following tests or measurements: Height, weight, and BMI. Blood pressure. Lipid and cholesterol levels. These may be checked every 5 years, or more frequently if you are over 17 years old. Skin check. Lung cancer screening. You may have this screening every year starting at age 55 if you have a 30-pack-year history of smoking and currently smoke or have quit within the past 15 years. Fecal occult blood test (FOBT) of the stool. You may have this test every year starting at age 92. Flexible sigmoidoscopy or colonoscopy. You may have a sigmoidoscopy every 5 years or a colonoscopy every 10 years starting at age 72. Hepatitis C blood test. Hepatitis B blood test. Sexually transmitted disease (STD) testing. Diabetes screening. This is done by checking your blood sugar (glucose) after you have not eaten for a while (fasting). You may have this done every 1-3 years. Bone density scan. This is done to screen for osteoporosis. You may have this done starting at age 60. Mammogram. This may be done every 1-2 years. Talk to your health care provider about how often you should have regular mammograms. Talk with your health care provider about your test results, treatment options, and if necessary, the need for more tests. Vaccines  Your health care provider may recommend certain vaccines, such as: Influenza vaccine. This  is recommended every year. Tetanus,  diphtheria, and acellular pertussis (Tdap, Td) vaccine. You may need a Td booster every 10 years. Zoster vaccine. You may need this after age 33. Pneumococcal 13-valent conjugate (PCV13) vaccine. One dose is recommended after age 56. Pneumococcal polysaccharide (PPSV23) vaccine. One dose is recommended after age 8. Talk to your health care provider about which screenings and vaccines you need and how often you need them. This information is not intended to replace advice given to you by your health care provider. Make sure you discuss any questions you have with your health care provider. Document Released: 05/24/2015 Document Revised: 01/15/2016 Document Reviewed: 02/26/2015 Elsevier Interactive Patient Education  2017 [employer] Prevention in the Home Falls can cause injuries. They can happen to people of all ages. There are many things you can do to make your home safe and to help prevent falls. What can I do on the outside of my home? Regularly fix the edges of walkways and driveways and fix any cracks. Remove anything that might make you trip as you walk through a door, such as a raised step or threshold. Trim any bushes or trees on the path to your home. Use bright outdoor lighting. Clear any walking paths of anything that might make someone trip, such as rocks or tools. Regularly check to see if handrails are loose or broken. Make sure that both sides of any steps have handrails. Any raised decks and porches should have guardrails on the edges. Have any leaves, snow, or ice cleared regularly. Use sand or salt on walking paths during winter. Clean up any spills in your garage right away. This includes oil or grease spills. What can I do in the bathroom? Use night lights. Install grab bars by the toilet and in the tub and shower. Do not use towel bars as grab bars. Use non-skid mats or decals in the tub or shower. If you need to sit down in the shower, use a plastic,  non-slip stool. Keep the floor dry. Clean up any water that spills on the floor as soon as it happens. Remove soap buildup in the tub or shower regularly. Attach bath mats securely with double-sided non-slip rug tape. Do not have throw rugs and other things on the floor that can make you trip. What can I do in the bedroom? Use night lights. Make sure that you have a light by your bed that is easy to reach. Do not use any sheets or blankets that are too big for your bed. They should not hang down onto the floor. Have a firm chair that has side arms. You can use this for support while you get dressed. Do not have throw rugs and other things on the floor that can make you trip. What can I do in the kitchen? Clean up any spills right away. Avoid walking on wet floors. Keep items that you use a lot in easy-to-reach places. If you need to reach something above you, use a strong step stool that has a grab bar. Keep electrical cords out of the way. Do not use floor polish or wax that makes floors slippery. If you must use wax, use non-skid floor wax. Do not have throw rugs and other things on the floor that can make you trip. What can I do with my stairs? Do not leave any items on the stairs. Make sure that there are handrails on both sides of the stairs and use them. Fix handrails that  are broken or loose. Make sure that handrails are as long as the stairways. Check any carpeting to make sure that it is firmly attached to the stairs. Fix any carpet that is loose or worn. Avoid having throw rugs at the top or bottom of the stairs. If you do have throw rugs, attach them to the floor with carpet tape. Make sure that you have a light switch at the top of the stairs and the bottom of the stairs. If you do not have them, ask someone to add them for you. What else can I do to help prevent falls? Wear shoes that: Do not have high heels. Have rubber bottoms. Are comfortable and fit you well. Are closed  at the toe. Do not wear sandals. If you use a stepladder: Make sure that it is fully opened. Do not climb a closed stepladder. Make sure that both sides of the stepladder are locked into place. Ask someone to hold it for you, if possible. Clearly mark and make sure that you can see: Any grab bars or handrails. First and last steps. Where the edge of each step is. Use tools that help you move around (mobility aids) if they are needed. These include: Canes. Walkers. Scooters. Crutches. Turn on the lights when you go into a dark area. Replace any light bulbs as soon as they burn out. Set up your furniture so you have a clear path. Avoid moving your furniture around. If any of your floors are uneven, fix them. If there are any pets around you, be aware of where they are. Review your medicines with your doctor. Some medicines can make you feel dizzy. This can increase your chance of falling. Ask your doctor what other things that you can do to help prevent falls. This information is not intended to replace advice given to you by your health care provider. Make sure you discuss any questions you have with your health care provider. Document Released: 02/21/2009 Document Revised: 10/03/2015 Document Reviewed: 06/01/2014 Elsevier Interactive Patient Education  2017 [employer].

## 2021-06-12 NOTE — Progress Notes (Signed)
Subjective:   Madeline Pitts is a 71 y.o. female who presents for Medicare Annual (Subsequent) preventive examination.  Virtual Visit via Telephone Note  I connected with  Madeline Pitts on 06/12/21 at 11:20 AM EST by telephone and verified that I am speaking with the correct person using two identifiers.  Location: Patient: home Provider: [place] Persons participating in the virtual visit: [place]   I discussed the limitations, risks, security and privacy concerns of performing an evaluation and management service by telephone and the availability of in person appointments. The patient expressed understanding and agreed to proceed.  Interactive audio and video telecommunications were attempted between this nurse and patient, however failed, due to patient having technical difficulties OR patient did not have access to video capability.  We continued and completed visit with audio only.  Some vital signs may be absent or patient reported.   Clemetine Marker, LPN   Review of Systems     Cardiac Risk Factors include: advanced age (>68men, >35 women);hypertension;dyslipidemia;obesity (BMI >30kg/m2)     Objective:    There were no vitals filed for this visit. There is no height or weight on file to calculate BMI.  Advanced Directives 06/12/2021 05/02/2020 03/28/2019 10/06/2017 08/26/2016 05/26/2016 04/25/2015  Does Patient Have a Medical Advance Directive? No No No No No No No  Would patient like information on creating a medical advance directive? No - Patient declined No - Patient declined Yes (MAU/Ambulatory/Procedural Areas - Information given) No - Patient declined - - No - patient declined information    Current Medications (verified) Outpatient Encounter Medications as of 06/12/2021  Medication Sig   ammonium lactate (AMLACTIN) 12 % lotion Apply 1 application topically as needed for dry skin.   Apoaequorin (PREVAGEN PO) Take by mouth.   Ginseng 250  MG CAPS Take by mouth.   GLUCOSAMINE SULFATE-MSM PO Take 2 tablets by mouth once.   hydrocortisone 2.5 % cream Apply topically 2 (two) times daily.   Iron, Ferrous Sulfate, 325 (65 Fe) MG TABS Take 325 mg by mouth daily.   losartan-hydrochlorothiazide (HYZAAR) 50-12.5 MG tablet Take 1 tablet by mouth daily.   meclizine (ANTIVERT) 25 MG tablet Take 1 tablet (25 mg total) by mouth 3 (three) times daily as needed for dizziness.   Multiple Vitamin (MULTI-VITAMINS) TABS Take by mouth.   Omega-3 Fatty Acids (FISH OIL) 1000 MG CAPS Take 1 capsule by mouth 3 (three) times a week.   ondansetron (ZOFRAN) 4 MG tablet Take 1 tablet (4 mg total) by mouth every 6 (six) hours.   PREMPRO 0.3-1.5 MG tablet Take 1 tablet by mouth daily.   TURMERIC PO Take by mouth.   zinc gluconate 50 MG tablet Take 50 mg by mouth 2 (two) times a week.   [DISCONTINUED] estrogen, conjugated,-medroxyprogesterone (PREMPRO) 0.45-1.5 MG tablet Take by mouth.   [DISCONTINUED] vitamin B-12 (CYANOCOBALAMIN) 500 MCG tablet Take 500 mcg by mouth daily.   [DISCONTINUED] vitamin C (ASCORBIC ACID) 500 MG tablet Take 500 mg by mouth daily.   No facility-administered encounter medications on file as of 06/12/2021.    Allergies (verified) Codeine and Penicillins   History: Past Medical History:  Diagnosis Date   Hypertension    Obesity    Prediabetes    Vitamin D deficiency    Past Surgical History:  Procedure Laterality Date   BREAST BIOPSY Right 09/26/2020   stereo biopsy/ coil clip/ benign   BREAST BIOPSY Right 09/26/2020   stereo biopsy/ coil clip/ benign  COLONOSCOPY  2009?   Dr Alveta Heimlich   COLONOSCOPY WITH PROPOFOL N/A 10/06/2017   Procedure: COLONOSCOPY WITH PROPOFOL;  Surgeon: Robert Bellow, MD;  Location: [facility];  Service: Endoscopy;  Laterality: N/A;   ENDOMETRIAL ABLATION     Family History  Problem Relation Age of Onset   Uterine cancer Mother    Gout Father    COPD Father    Emphysema Father         Was a heavy smoker   Lung cancer Brother        smoker   Lung cancer Brother    Breast cancer Neg Hx    Social History   Socioeconomic History   Marital status: Married    Spouse name: Karle Starch    Number of children: 2   Years of education: Not on file   Highest education level: Bachelor's degree (e.g., BA, AB, BS)  Occupational History   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   Tobacco Use   Smoking status: Never   Smokeless tobacco: Never  Vaping Use   Vaping Use: Never used  Substance and Sexual Activity   Alcohol use: No    Alcohol/week: 0.0 standard drinks   Drug use: No   Sexual activity: Yes    Partners: Male    Birth control/protection: Post-menopausal  Other Topics Concern   Not on file  Social History Narrative   Not on file   Social Determinants of Health   Financial Resource Strain: Low Risk    Difficulty of Paying Living Expenses: Not hard at all  Food Insecurity: No Food Insecurity   Worried About Charity fundraiser in the Last Year: Never true   [place] in the Last Year: Never true  Transportation Needs: No Transportation Needs   Lack of Transportation (Medical): No   Lack of Transportation (Non-Medical): No  Physical Activity: Sufficiently Active   Days of Exercise per Week: 5 days   Minutes of Exercise per Session: 30 min  Stress: No Stress Concern Present   Feeling of Stress : Not at all  Social Connections: Socially Integrated   Frequency of Communication with Friends and Family: More than three times a week   Frequency of Social Gatherings with Friends and Family: Once a week   Attends Religious Services: More than 4 times per year   Active Member of [employer] or Organizations: Yes   Attends Music therapist: More than 4 times per year   Marital Status: Married    Tobacco Counseling Counseling given: Not Answered   Clinical Intake:  Pre-visit preparation completed: Yes  Pain : No/denies pain      Nutritional Risks: None Diabetes: No  How often do you need to have someone help you when you read instructions, pamphlets, or other written materials from your doctor or pharmacy?: 1 - Never    Interpreter Needed?: No  Information entered by :: Clemetine Marker LPN   Activities of Daily Living In your present state of health, do you have any difficulty performing the following activities: 06/12/2021 03/06/2021  Hearing? N N  Vision? N N  Difficulty concentrating or making decisions? N N  Walking or climbing stairs? N N  Dressing or bathing? N N  Doing errands, shopping? N N  Preparing Food and eating ? N -  Using the Toilet? N -  In the past six months, have you accidently leaked urine? N -  Do you have problems with loss of bowel  control? N -  Managing your Medications? N -  Managing your Finances? N -  Housekeeping or managing your Housekeeping? N -  Some recent data might be hidden    Patient Care Team: Steele Sizer, MD as PCP - General (Family Medicine) Wyatt Haste, MD as Referring Physician (Obstetrics and Gynecology)  Indicate any recent Medical Services you may have received from other than [facility] providers in the past year (date may be approximate).     Assessment:   This is a routine wellness examination for [place].  Hearing/Vision screen Hearing Screening - Comments:: Pt denies hearing difficulty Vision Screening - Comments:: Annual vision screenings done at [facility] in [place]; due for exam   Dietary issues and exercise activities discussed: Current Exercise Habits: Home exercise routine, Type of exercise: calisthenics, Time (Minutes): 30, Frequency (Times/Week): 5, Weekly Exercise (Minutes/Week): 150, Intensity: Moderate, Exercise limited by: None identified   Goals Addressed   None    Depression Screen PHQ 2/9 Scores 06/12/2021 03/06/2021 01/17/2021 12/19/2020 12/03/2020 05/02/2020 03/28/2019  PHQ - 2 Score 0 0 0 0 0 0 0  PHQ- 9 Score - 0 - - - - -     Fall Risk Fall Risk  06/12/2021 03/06/2021 01/17/2021 12/19/2020 12/03/2020  Falls in the past year? 0 0 0 0 0  Number falls in past yr: 0 - 0 0 0  Injury with Fall? 0 - 0 0 0  Risk for fall due to : No Fall Risks - No Fall Risks No Fall Risks -  Follow up Falls prevention discussed Falls prevention discussed Falls prevention discussed Falls prevention discussed -    FALL RISK PREVENTION PERTAINING TO THE HOME:  Any stairs in or around the home? Yes  If so, are there any without handrails? Yes - 3 steps outside Home free of loose throw rugs in walkways, pet beds, electrical cords, etc? Yes  Adequate lighting in your home to reduce risk of falls? Yes   ASSISTIVE DEVICES UTILIZED TO PREVENT FALLS:  Life alert? No  Use of a cane, walker or w/c? No  Grab bars in the bathroom? Yes  Shower chair or bench in shower? No  Elevated toilet seat or a handicapped toilet? No   TIMED UP AND GO:  Was the test performed? No . Telephonic visit.   Cognitive Function: Normal cognitive status assessed by direct observation by this Nurse Health Advisor. No abnormalities found.       6CIT Screen 05/02/2020  What Year? 0 points  What month? 0 points  What time? 0 points  Count back from 20 0 points  Months in reverse 0 points  Repeat phrase 0 points  Total Score 0    Immunizations Immunization History  Administered Date(s) Administered   Fluad Quad(high Dose 65+) 03/28/2019, 05/02/2020   Influenza, High Dose Seasonal PF 01/17/2021   Moderna Covid-19 Vaccine Bivalent Booster 49yrs & up 04/01/2021   Moderna Sars-Covid-2 Vaccination 06/09/2019, 07/07/2019, 03/15/2020, 12/21/2020   PNEUMOCOCCAL CONJUGATE-20 12/03/2020   Pneumococcal Conjugate-13 12/19/2018   Tdap 03/14/2012    TDAP status: Up to date  Flu Vaccine status: Up to date  Pneumococcal vaccine status: Up to date  Covid-19 vaccine status: Completed vaccines  Qualifies for Shingles Vaccine? Yes   Zostavax completed No    Shingrix Completed?: No.    Education has been provided regarding the importance of this vaccine. Patient has been advised to call insurance company to determine out of pocket expense if they have not yet received this vaccine.  Advised may also receive vaccine at local pharmacy or Health Dept. Verbalized acceptance and understanding.  Screening Tests Health Maintenance  Topic Date Due   Zoster Vaccines- Shingrix (1 of 2) Never done   MAMMOGRAM  09/04/2021   TETANUS/TDAP  03/14/2022   COLONOSCOPY (Pts 45-2yrs Insurance coverage will need to be confirmed)  10/07/2022   Pneumonia Vaccine 37+ Years old  Completed   INFLUENZA VACCINE  Completed   DEXA SCAN  Completed   COVID-19 Vaccine  Completed   Hepatitis C Screening  Completed   HPV VACCINES  Aged Out    Health Maintenance  Health Maintenance Due  Topic Date Due   Zoster Vaccines- Shingrix (1 of 2) Never done    Colorectal cancer screening: Type of screening: Colonoscopy. Completed 10/06/17. Repeat every 5 years  Mammogram status: Completed 09/04/20. Repeat every year  Bone Density status: Completed 03/28/19. Results reflect: Bone density results: NORMAL. Repeat every 2 years.  Lung Cancer Screening: (Low Dose CT Chest recommended if Age 33-80 years, 30 pack-year currently smoking OR have quit w/in 15years.) does not qualify.   Additional Screening:  Hepatitis C Screening: does qualify; Completed 08/21/16  Vision Screening: Recommended annual ophthalmology exams for early detection of glaucoma and other disorders of the eye. Is the patient up to date with their annual eye exam?  No  Who is the provider or what is the name of the office in which the patient attends annual eye exams? [facility].   Dental Screening: Recommended annual dental exams for proper oral hygiene  Community Resource Referral / Chronic Care Management: CRR required this visit?  No   CCM required this visit?  No      Plan:     I have personally  reviewed and noted the following in the patients chart:   Medical and social history Use of alcohol, tobacco or illicit drugs  Current medications and supplements including opioid prescriptions.  Functional ability and status Nutritional status Physical activity Advanced directives List of other physicians Hospitalizations, surgeries, and ER visits in previous 12 months Vitals Screenings to include cognitive, depression, and falls Referrals and appointments  In addition, I have reviewed and discussed with patient certain preventive protocols, quality metrics, and best practice recommendations. A written personalized care plan for preventive services as well as general preventive health recommendations were provided to patient.     Clemetine Marker, LPN   10/12/4032   Nurse Notes: pt aware due for repeat labs to check iron; plans to have drawn prior to next appt on 06/24/21

## 2021-06-20 ENCOUNTER — Ambulatory Visit
Admission: RE | Admit: 2021-06-20 | Discharge: 2021-06-20 | Disposition: A | Discharge status: Home / Self Care | Payer: PPO | Source: Ambulatory Visit | Attending: Otolaryngology | Admitting: Otolaryngology

## 2021-06-20 ENCOUNTER — Other Ambulatory Visit: Payer: Self-pay

## 2021-06-20 DIAGNOSIS — R591 Generalized enlarged lymph nodes: Secondary | ICD-10-CM | POA: Insufficient documentation

## 2021-06-20 DIAGNOSIS — D509 Iron deficiency anemia, unspecified: Secondary | ICD-10-CM | POA: Diagnosis not present

## 2021-06-20 DIAGNOSIS — D110 Benign neoplasm of parotid gland: Secondary | ICD-10-CM | POA: Diagnosis not present

## 2021-06-20 LAB — POCT I-STAT CREATININE: Creatinine, Ser: 1.1 mg/dL — ABNORMAL HIGH (ref 0.44–1.00)

## 2021-06-20 MED ORDER — IOHEXOL 300 MG/ML  SOLN
100.0000 mL | Freq: Once | INTRAMUSCULAR | Status: AC | PRN
  Administered 2021-06-20: 100 mL via INTRAVENOUS

## 2021-06-21 LAB — IRON,TIBC AND FERRITIN PANEL
%SAT: 27 % (calc) (ref 16–45)
Ferritin: 22 ng/mL (ref 16–288)
Iron: 85 ug/dL (ref 45–160)
TIBC: 318 mcg/dL (calc) (ref 250–450)

## 2021-06-21 LAB — CBC WITH DIFFERENTIAL/PLATELET
Absolute Monocytes: 406 cells/uL (ref 200–950)
Basophils Absolute: 39 cells/uL (ref 0–200)
Basophils Relative: 1 %
Eosinophils Absolute: 109 cells/uL (ref 15–500)
Eosinophils Relative: 2.8 %
HCT: 38.4 % (ref 35.0–45.0)
Hemoglobin: 12.5 g/dL (ref 11.7–15.5)
Lymphs Abs: 1369 cells/uL (ref 850–3900)
MCH: 26.8 pg — ABNORMAL LOW (ref 27.0–33.0)
MCHC: 32.6 g/dL (ref 32.0–36.0)
MCV: 82.4 fL (ref 80.0–100.0)
MPV: 10.3 fL (ref 7.5–12.5)
Monocytes Relative: 10.4 %
Neutro Abs: 1977 cells/uL (ref 1500–7800)
Neutrophils Relative %: 50.7 %
Platelets: 291 10*3/uL (ref 140–400)
RBC: 4.66 10*6/uL (ref 3.80–5.10)
RDW: 14.9 % (ref 11.0–15.0)
Total Lymphocyte: 35.1 %
WBC: 3.9 10*3/uL (ref 3.8–10.8)

## 2021-06-23 ENCOUNTER — Ambulatory Visit: Payer: PPO | Admitting: Family Medicine

## 2021-06-23 NOTE — Progress Notes (Signed)
Name: Madeline Pitts   MRN: [id number]    DOB: 1950/09/10   Date:06/24/2021       Progress Note  Subjective  Chief Complaint  Follow Up  HPI  HTN: she has been taking medication as prescribed, and denies side effects. No chest pain, dizziness  or palpitation. BP towards high end of normal but in the past while on 100/12.5 bp was low, we will continue to monitor    Leucopenia: slight improvement since last year , we will recheck labs    Morbid obeisty:  she has HTN and hyperglycemia and dyslipidemia with a BMI above 35. She no longer drinks sodas, eating more vegetables and fruit   Vitamin D deficiency: taking otc supplementation twice a week and last level at goal,    Pre-diabetes: last hgbA1C was 6.1% and stable. She denies polydipsia, polyuria or polyphagia. We will recheck labs   Hyperlipidemia: based on calculation below I recommended patient to start taking statins and she is wiling to try it , we will send Atorvastatin to her pharmacy   The 10-year ASCVD risk score (Arnett DK, et al., 2019) is: 14%   Values used to calculate the score:     Age: 40 years     Sex: Female     Is Non-Hispanic African American: Yes     Diabetic: No     Tobacco smoker: No     Systolic Blood Pressure: 557 mmHg     Is BP treated: Yes     HDL Cholesterol: 51 mg/dL     Total Cholesterol: 225 mg/dL   Neutropenia: it was low in 2020 and also in 2022, normal when last checked, we will keep on monitoring   Iron deficiency anemia: it happened during COVID-19 Summer 2022 , she has been taking iron supplementation and last level improved, colonoscopy up to date but we will check hemoccult stools and if positive refer to GI, if iron storage back to normal and negative hemoccult stools we will stop supplementation    Patient Active Problem List   Diagnosis Date Noted   Pre-diabetes 08/26/2016   Dyslipidemia 08/26/2016   Fibroid 04/25/2015   BP (high blood pressure) 04/25/2015   Climacteric  09/08/2012   Menopausal symptom 09/08/2012   Adult BMI 30+ 12/16/2010    Past Surgical History:  Procedure Laterality Date   BREAST BIOPSY Right 09/26/2020   stereo biopsy/ coil clip/ benign   BREAST BIOPSY Right 09/26/2020   stereo biopsy/ coil clip/ benign   COLONOSCOPY  2009?   Dr Alveta Heimlich   COLONOSCOPY WITH PROPOFOL N/A 10/06/2017   Procedure: COLONOSCOPY WITH PROPOFOL;  Surgeon: Robert Bellow, MD;  Location: [facility] ENDOSCOPY;  Service: Endoscopy;  Laterality: N/A;   ENDOMETRIAL ABLATION      Family History  Problem Relation Age of Onset   Uterine cancer Mother    Gout Father    COPD Father    Emphysema Father        Was a heavy smoker   Lung cancer Brother        smoker   Lung cancer Brother    Breast cancer Neg Hx     Social History   Tobacco Use   Smoking status: Never   Smokeless tobacco: Never  Substance Use Topics   Alcohol use: No    Alcohol/week: 0.0 standard drinks     Current Outpatient Medications:    ammonium lactate (AMLACTIN) 12 % lotion, Apply 1 application topically as needed for dry skin.,  Disp: 400 g, Rfl: 3   Ginseng 250 MG CAPS, Take by mouth., Disp: , Rfl:    GLUCOSAMINE SULFATE-MSM PO, Take 2 tablets by mouth once., Disp: , Rfl:    hydrocortisone 2.5 % cream, Apply topically 2 (two) times daily., Disp: 30 g, Rfl: 0   Iron, Ferrous Sulfate, 325 (65 Fe) MG TABS, Take 325 mg by mouth daily., Disp: 30 tablet, Rfl: 5   losartan-hydrochlorothiazide (HYZAAR) 50-12.5 MG tablet, Take 1 tablet by mouth daily., Disp: 90 tablet, Rfl: 1   meclizine (ANTIVERT) 25 MG tablet, Take 1 tablet (25 mg total) by mouth 3 (three) times daily as needed for dizziness., Disp: 30 tablet, Rfl: 0   Multiple Vitamin (MULTI-VITAMINS) TABS, Take by mouth., Disp: , Rfl:    Omega-3 Fatty Acids (FISH OIL) 1000 MG CAPS, Take 1 capsule by mouth 3 (three) times a week., Disp: , Rfl:    ondansetron (ZOFRAN) 4 MG tablet, Take 1 tablet (4 mg total) by mouth every 6 (six) hours.,  Disp: 12 tablet, Rfl: 0   PREMPRO 0.3-1.5 MG tablet, Take 1 tablet by mouth daily., Disp: , Rfl:    TURMERIC PO, Take by mouth., Disp: , Rfl:    zinc gluconate 50 MG tablet, Take 50 mg by mouth 2 (two) times a week., Disp: , Rfl:    Apoaequorin (PREVAGEN PO), Take by mouth. (Patient not taking: Reported on 06/24/2021), Disp: , Rfl:   Allergies  Allergen Reactions   Codeine Nausea Only and Other (See Comments)    Makes patient feel lethargic-can't move   Penicillins Nausea Only    I personally reviewed active problem list, medication list, allergies, family history, social history, health maintenance with the patient/caregiver today.   ROS  Constitutional: Negative for fever or weight change.  Respiratory: Negative for cough and shortness of breath.   Cardiovascular: Negative for chest pain or palpitations.  Gastrointestinal: Negative for abdominal pain, no bowel changes.  Musculoskeletal: Negative for gait problem or joint swelling.  Skin: Negative for rash.  Neurological: Negative for dizziness or headache.  No other specific complaints in a complete review of systems (except as listed in HPI above).   Objective  Vitals:   06/24/21 1101  BP: 138/82  Pulse: 95  Resp: 16  SpO2: 99%  Weight: 197 lb (89.4 kg)  Height: 5\' 2"  (1.575 m)    Body mass index is 36.03 kg/m.  Physical Exam  Constitutional: Patient appears well-developed and well-nourished. Obese  No distress.  HEENT: head atraumatic, normocephalic, pupils equal and reactive to light, neck supple Cardiovascular: Normal rate, regular rhythm and normal heart sounds.  No murmur heard. No BLE edema. Pulmonary/Chest: Effort normal and breath sounds normal. No respiratory distress. Abdominal: Soft.  There is no tenderness. Psychiatric: Patient has a normal mood and affect. behavior is normal. Judgment and thought content normal.   Recent Results (from the past 2160 hour(s))  CBC with Differential/Platelet     Status:  Abnormal   Collection Time: 06/20/21  2:54 PM  Result Value Ref Range   WBC 3.9 3.8 - 10.8 Thousand/uL   RBC 4.66 3.80 - 5.10 Million/uL   Hemoglobin 12.5 11.7 - 15.5 g/dL   HCT 38.4 35.0 - 45.0 %   MCV 82.4 80.0 - 100.0 fL   MCH 26.8 (L) 27.0 - 33.0 pg   MCHC 32.6 32.0 - 36.0 g/dL   RDW 14.9 11.0 - 15.0 %   Platelets 291 140 - 400 Thousand/uL   MPV 10.3 7.5 -  12.5 fL   Neutro Abs 1,977 1,500 - 7,800 cells/uL   Lymphs Abs 1,369 850 - 3,900 cells/uL   Absolute Monocytes 406 200 - 950 cells/uL   Eosinophils Absolute 109 15 - 500 cells/uL   Basophils Absolute 39 0 - 200 cells/uL   Neutrophils Relative % 50.7 %   Total Lymphocyte 35.1 %   Monocytes Relative 10.4 %   Eosinophils Relative 2.8 %   Basophils Relative 1.0 %  Iron, TIBC and Ferritin Panel     Status: None   Collection Time: 06/20/21  2:54 PM  Result Value Ref Range   Iron 85 45 - 160 mcg/dL   TIBC 318 250 - 450 mcg/dL (calc)   %SAT 27 16 - 45 % (calc)   Ferritin 22 16 - 288 ng/mL  I-STAT creatinine     Status: Abnormal   Collection Time: 06/20/21  3:26 PM  Result Value Ref Range   Creatinine, Ser 1.10 (H) 0.44 - 1.00 mg/dL    PHQ2/9: Depression screen [facility] 2/9 06/24/2021 06/12/2021 03/06/2021 01/17/2021 12/19/2020  Decreased Interest 0 0 0 0 0  Down, Depressed, Hopeless 0 0 0 0 0  PHQ - 2 Score 0 0 0 0 0  Altered sleeping 0 - 0 - -  Tired, decreased energy 0 - 0 - -  Change in appetite 0 - 0 - -  Feeling bad or failure about yourself  0 - 0 - -  Trouble concentrating 0 - 0 - -  Moving slowly or fidgety/restless 0 - 0 - -  Suicidal thoughts 0 - 0 - -  PHQ-9 Score 0 - 0 - -    phq 9 is negative   Fall Risk: Fall Risk  06/24/2021 06/12/2021 03/06/2021 01/17/2021 12/19/2020  Falls in the past year? 0 0 0 0 0  Number falls in past yr: 0 0 - 0 0  Injury with Fall? 0 0 - 0 0  Risk for fall due to : No Fall Risks No Fall Risks - No Fall Risks No Fall Risks  Follow up Falls prevention discussed Falls prevention discussed  Falls prevention discussed Falls prevention discussed Falls prevention discussed      Functional Status Survey: Is the patient deaf or have difficulty hearing?: No Does the patient have difficulty seeing, even when wearing glasses/contacts?: No Does the patient have difficulty concentrating, remembering, or making decisions?: No Does the patient have difficulty walking or climbing stairs?: No Does the patient have difficulty dressing or bathing?: No Does the patient have difficulty doing errands alone such as visiting a doctor's office or shopping?: No    Assessment & Plan  1. Morbid obesity ([place])  Discussed with the patient the risk posed by an increased BMI. Discussed importance of portion control, calorie counting and at least 150 minutes of physical activity weekly. Avoid sweet beverages and drink more water. Eat at least 6 servings of fruit and vegetables daily    2. Iron deficiency anemia, unspecified iron deficiency anemia type  - POC Hemoccult Bld/Stl (3-Cd Home Screen); Future - CBC with Differential/Platelet - Iron, TIBC and Ferritin Panel  3. Dyslipidemia  - atorvastatin (LIPITOR) 20 MG tablet; Take 1 tablet (20 mg total) by mouth daily.  Dispense: 90 tablet; Refill: 1 - Lipid panel  4. Vitamin D deficiency   5. Essential hypertension  - losartan-hydrochlorothiazide (HYZAAR) 50-12.5 MG tablet; Take 1 tablet by mouth daily.  Dispense: 90 tablet; Refill: 1 - COMPLETE METABOLIC PANEL WITH GFR  6. Need for  shingles vaccine  - Zoster Vaccine Adjuvanted [facility]) injection; Inject 0.5 mLs into the muscle once for 1 dose.  Dispense: 0.5 mL; Refill: 1  7. Other neutropenia ([place])  Recheck level   8. Pre-diabetes   9. Hyperglycemia  - Hemoglobin A1c

## 2021-06-24 ENCOUNTER — Encounter: Payer: Self-pay | Admitting: Family Medicine

## 2021-06-24 ENCOUNTER — Ambulatory Visit (INDEPENDENT_AMBULATORY_CARE_PROVIDER_SITE_OTHER): Payer: PPO | Admitting: Family Medicine

## 2021-06-24 ENCOUNTER — Other Ambulatory Visit: Payer: Self-pay

## 2021-06-24 DIAGNOSIS — E559 Vitamin D deficiency, unspecified: Secondary | ICD-10-CM

## 2021-06-24 DIAGNOSIS — D708 Other neutropenia: Secondary | ICD-10-CM | POA: Diagnosis not present

## 2021-06-24 DIAGNOSIS — R7303 Prediabetes: Secondary | ICD-10-CM | POA: Diagnosis not present

## 2021-06-24 DIAGNOSIS — D509 Iron deficiency anemia, unspecified: Secondary | ICD-10-CM | POA: Diagnosis not present

## 2021-06-24 DIAGNOSIS — I10 Essential (primary) hypertension: Secondary | ICD-10-CM

## 2021-06-24 DIAGNOSIS — Z23 Encounter for immunization: Secondary | ICD-10-CM

## 2021-06-24 DIAGNOSIS — E785 Hyperlipidemia, unspecified: Secondary | ICD-10-CM

## 2021-06-24 DIAGNOSIS — R739 Hyperglycemia, unspecified: Secondary | ICD-10-CM | POA: Diagnosis not present

## 2021-06-24 MED ORDER — LOSARTAN POTASSIUM-HCTZ 50-12.5 MG PO TABS: 1.0000 | ORAL_TABLET | Freq: Every day | ORAL | 1 refills | Status: DC

## 2021-06-24 MED ORDER — ATORVASTATIN CALCIUM 20 MG PO TABS: 20.0000 mg | ORAL_TABLET | Freq: Every day | ORAL | 1 refills | Status: DC

## 2021-06-24 MED ORDER — SHINGRIX 50 MCG/0.5ML IM SUSR: 0.5000 mL | Freq: Once | INTRAMUSCULAR | 1 refills | Status: AC

## 2021-07-02 IMAGING — MG MM BREAST BX W/ LOC DEV 1ST LESION IMAGE BX SPEC STEREO GUIDE*R*
7 of 13 series · 7 of 21 positions shown · non-contrast
Comparison: Previous exams.
COMPARISON: Previous exams.

Addendum:
CLINICAL DATA: 69-year-old female with indeterminate right breast
asymmetry.

EXAM:
RIGHT BREAST STEREOTACTIC CORE NEEDLE BIOPSY

[R (1 of 7)]
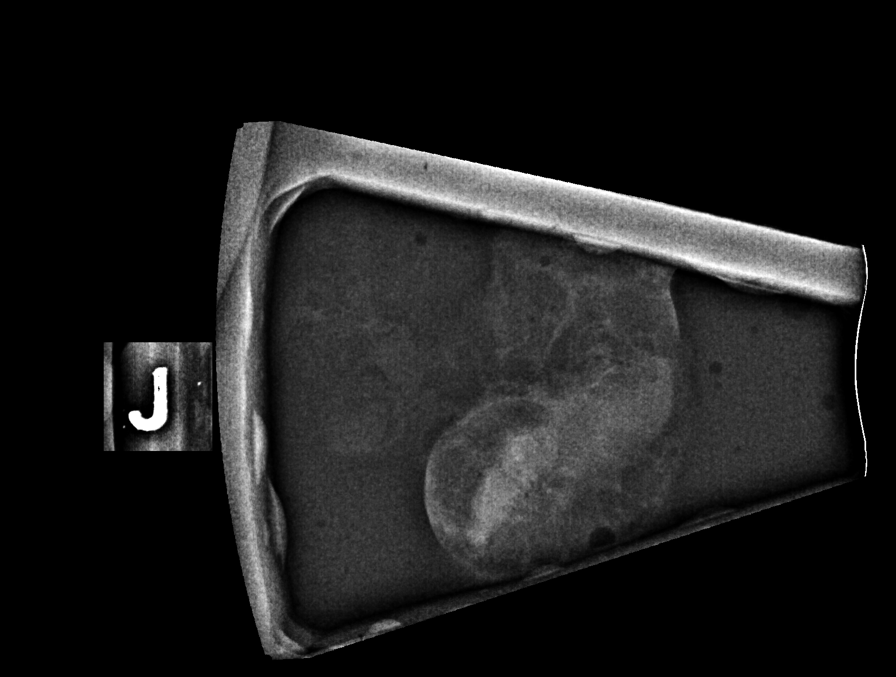

[R (2 of 7)]
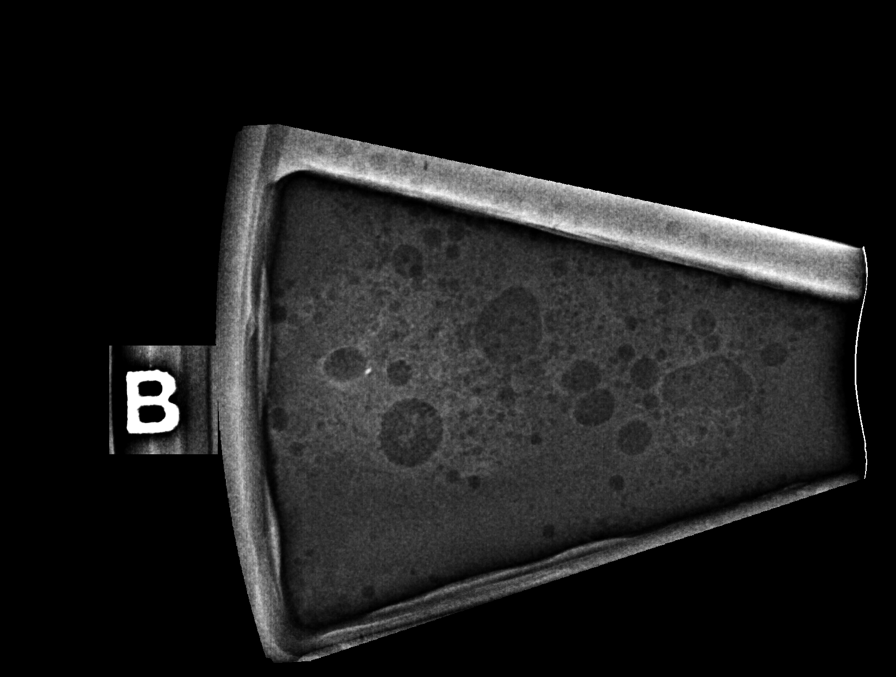

[R (3 of 7)]
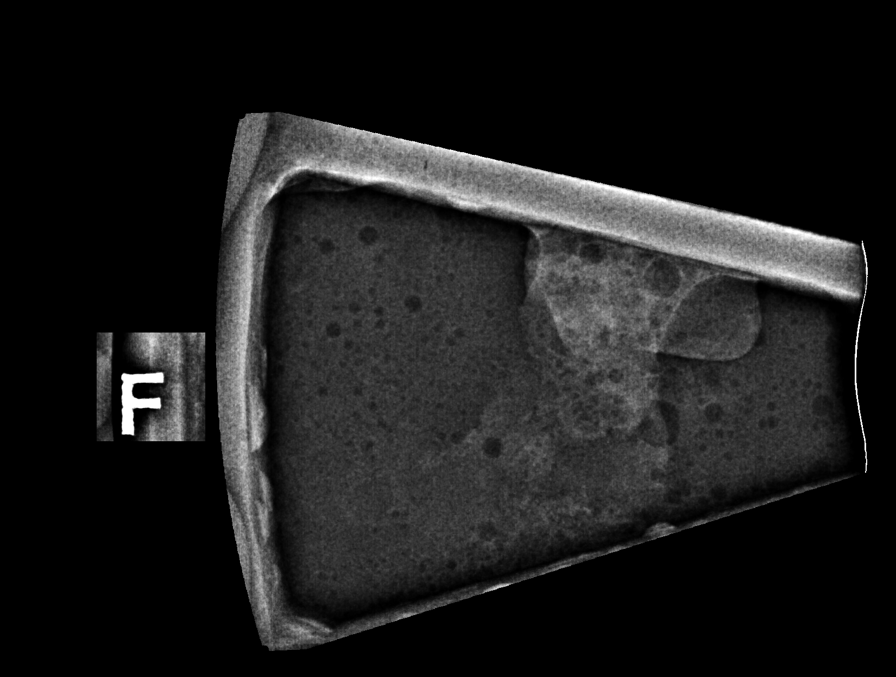

[R (4 of 7)]
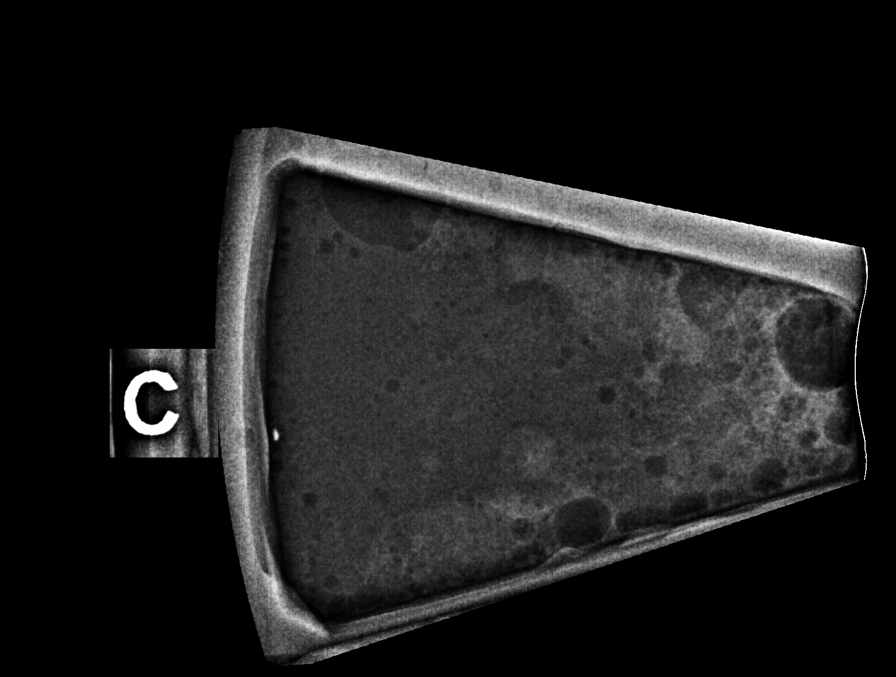

[R (5 of 7)]
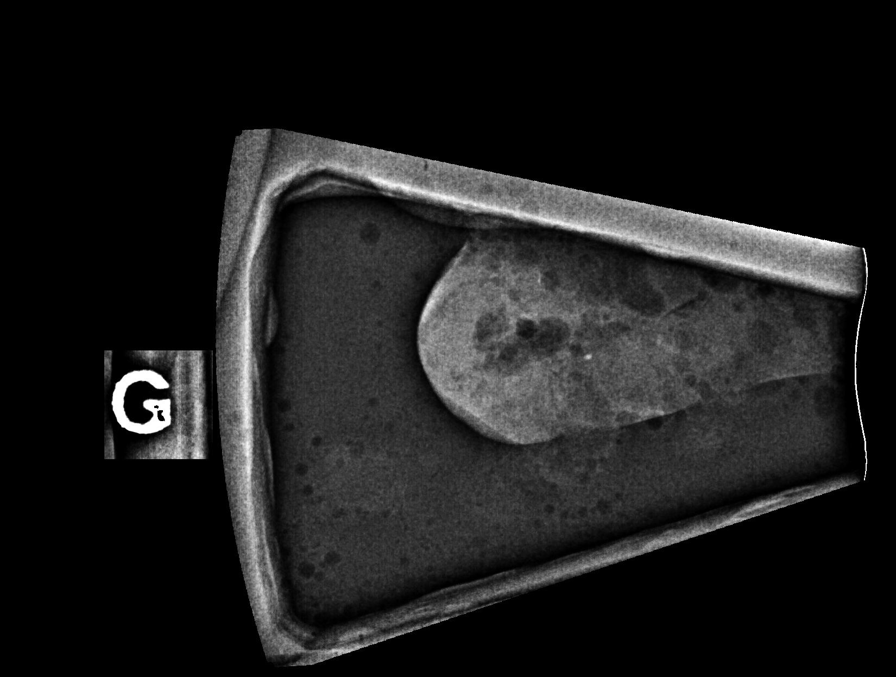

[R (6 of 7)]
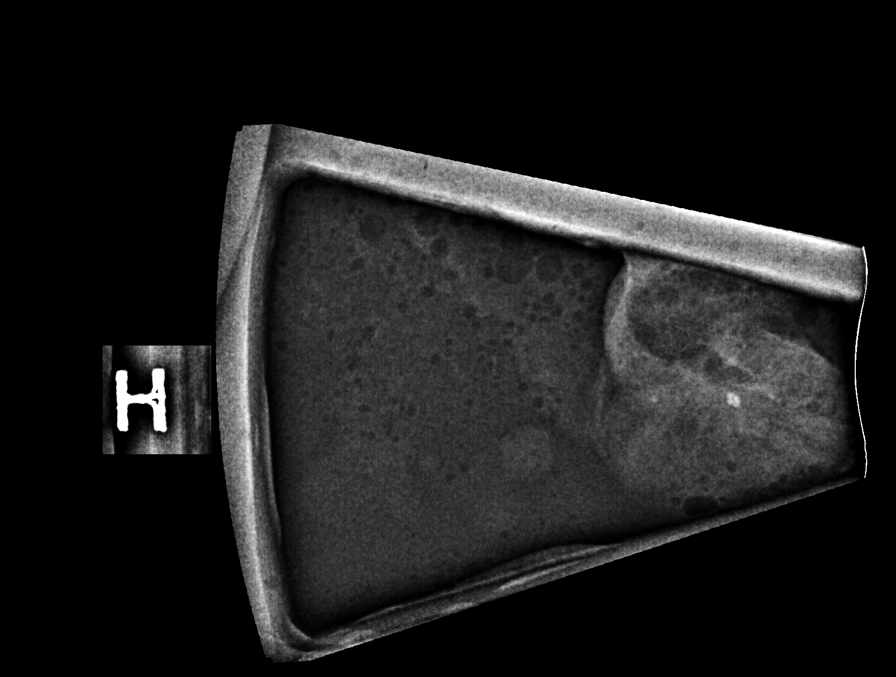

[R (7 of 7)]
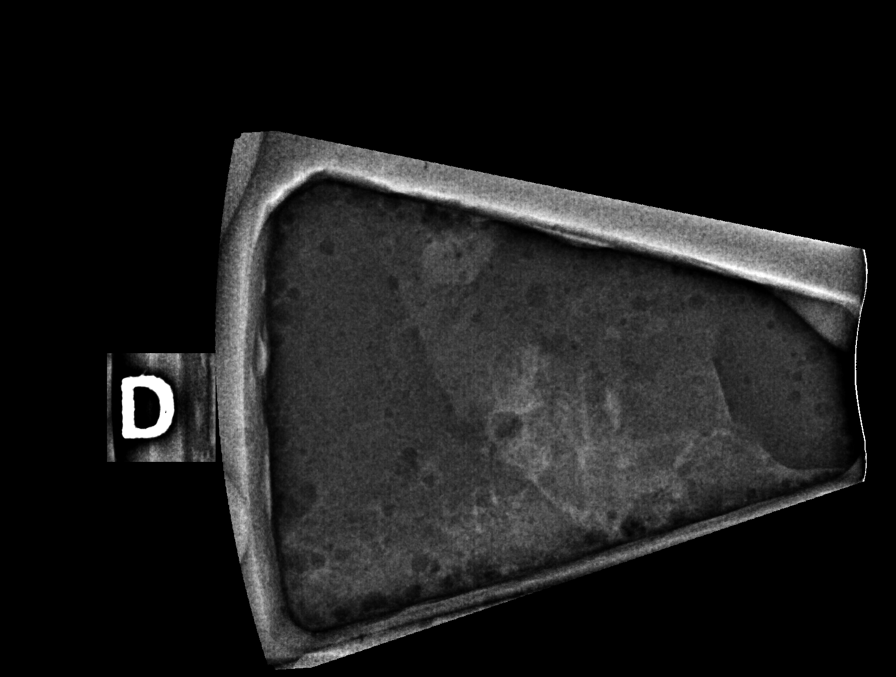

[7 of 21 positions shown; findings below may reference images not displayed]



Lesion quadrant: Upper outer quadrant

Using sterile technique and 1% Lidocaine as local anesthetic, under
stereotactic guidance, a 9 gauge vacuum assisted device was used to
perform core needle biopsy of an asymmetry with associated
calcifications in the upper-outer right breast at middle depth using
a superior approach. Specimen radiograph was performed showing
calcifications in several specimens. Specimens with calcifications
are identified for pathology. At the conclusion of the procedure, a
coil shaped tissue marker clip was deployed into the biopsy cavity.

Lesion quadrant: Upper outer quadrant

Using sterile technique and 1% Lidocaine as local anesthetic, under
stereotactic guidance, a 9 gauge vacuum assisted device was used to
perform core needle biopsy of an asymmetry in the upper-outer
quadrant of the right breast at posterior depth using a superior
approach. At the conclusion of the procedure, a ribbon shaped tissue
marker clip was deployed into the biopsy cavity.

Follow-up 2-view mammogram was performed and dictated separately.
IMPRESSION: Stereotactic-guided biopsy of the right breast x2. No apparent
complications.

ADDENDUM:
PATHOLOGY revealed: Site A. BREAST DISTORTION, RIGHT UPPER OUTER
QUADRANT MIDDLE (COIL); STEREOTACTIC BIOPSY: - SCLEROSING ADENOSIS
WITH FOCAL CALCIFICATION, PSEUDOANGIOMATOUS STROMAL HYPERPLASIA,
COLUMNAR CELL CHANGE, APOCRINE METAPLASIA, AND SIMPLE CYSTS. -
NEGATIVE FOR ATYPIA AND MALIGNANCY.

Pathology results are CONCORDANT with imaging findings, per Dr.
M Jabir Tom.

PATHOLOGY revealed: Site B. BREAST DISTORTION, RIGHT UPPER OUTER
QUADRANT POSTERIOR (RIBBON); STEREOTACTIC BIOPSY: -
PSEUDOANGIOMATOUS STROMAL HYPERPLASIA, SCLEROSING ADENOSIS, COLUMNAR
CELL CHANGE, AND SIMPLE CYST. - NEGATIVE FOR ATYPIA AND MALIGNANCY.

Pathology results are CONCORDANT with imaging findings, per Dr.
M Jabir Tom.

Pathology results and recommendations below were discussed with
patient by telephone on 09/27/2020. Patient reported biopsy site
within normal limits with slight tenderness at the site. Post biopsy
care instructions were reviewed, questions were answered and my
direct phone number was provided to patient. Patient was instructed
to call [HOSPITAL] if any concerns or questions arise
related to the biopsy.

Recommendation: Patient instructed to return in six months for
unilateral RIGHT breast diagnostic mammogram and possible ultrasound
to ensure stability of both biopsied sites. Patient informed a
reminder notice will be sent regarding this appointment and she will
need to call mammography site to schedule this appointment.

Pathology results reported by Siomy Meendeez RN on 09/27/2020.



Lesion quadrant: Upper outer quadrant

Using sterile technique and 1% Lidocaine as local anesthetic, under
stereotactic guidance, a 9 gauge vacuum assisted device was used to
perform core needle biopsy of an asymmetry with associated
calcifications in the upper-outer right breast at middle depth using
a superior approach. Specimen radiograph was performed showing
calcifications in several specimens. Specimens with calcifications
are identified for pathology. At the conclusion of the procedure, a
coil shaped tissue marker clip was deployed into the biopsy cavity.

Lesion quadrant: Upper outer quadrant

Using sterile technique and 1% Lidocaine as local anesthetic, under
stereotactic guidance, a 9 gauge vacuum assisted device was used to
perform core needle biopsy of an asymmetry in the upper-outer
quadrant of the right breast at posterior depth using a superior
approach. At the conclusion of the procedure, a ribbon shaped tissue
marker clip was deployed into the biopsy cavity.

Follow-up 2-view mammogram was performed and dictated separately.
IMPRESSION: Stereotactic-guided biopsy of the right breast x2. No apparent
complications.

## 2021-07-17 ENCOUNTER — Encounter (HOSPITAL_COMMUNITY): Payer: Self-pay | Admitting: Radiology

## 2021-09-18 ENCOUNTER — Telehealth: Payer: Self-pay

## 2021-09-18 ENCOUNTER — Ambulatory Visit (INDEPENDENT_AMBULATORY_CARE_PROVIDER_SITE_OTHER): Payer: PPO

## 2021-09-18 DIAGNOSIS — I10 Essential (primary) hypertension: Secondary | ICD-10-CM | POA: Diagnosis not present

## 2021-09-18 DIAGNOSIS — D509 Iron deficiency anemia, unspecified: Secondary | ICD-10-CM | POA: Diagnosis not present

## 2021-09-18 DIAGNOSIS — E785 Hyperlipidemia, unspecified: Secondary | ICD-10-CM | POA: Diagnosis not present

## 2021-09-18 DIAGNOSIS — R739 Hyperglycemia, unspecified: Secondary | ICD-10-CM | POA: Diagnosis not present

## 2021-09-18 LAB — POC HEMOCCULT BLD/STL (HOME/3-CARD/SCREEN)
Card #1 Date: 5102023
Card #2 Date: 5102023
Card #2 Fecal Occult Blod, POC: NEGATIVE
Card #3 Date: 5112023
Card #3 Fecal Occult Blood, POC: NEGATIVE
Fecal Occult Blood, POC: NEGATIVE

## 2021-09-18 NOTE — Telephone Encounter (Signed)
Stool card in the front lab

## 2021-09-18 NOTE — Telephone Encounter (Signed)
Resulted

## 2021-09-19 LAB — COMPLETE METABOLIC PANEL WITH GFR
AG Ratio: 1.8 (calc) (ref 1.0–2.5)
ALT: 16 U/L (ref 6–29)
AST: 15 U/L (ref 10–35)
Albumin: 4.2 g/dL (ref 3.6–5.1)
Alkaline phosphatase (APISO): 77 U/L (ref 37–153)
BUN: 15 mg/dL (ref 7–25)
CO2: 30 mmol/L (ref 20–32)
Calcium: 9.7 mg/dL (ref 8.6–10.4)
Chloride: 103 mmol/L (ref 98–110)
Creat: 0.94 mg/dL (ref 0.50–1.05)
Globulin: 2.3 g/dL (calc) (ref 1.9–3.7)
Glucose, Bld: 99 mg/dL (ref 65–99)
Potassium: 4.3 mmol/L (ref 3.5–5.3)
Sodium: 141 mmol/L (ref 135–146)
Total Bilirubin: 0.4 mg/dL (ref 0.2–1.2)
Total Protein: 6.5 g/dL (ref 6.1–8.1)
eGFR: 66 mL/min/{1.73_m2} (ref >=60)

## 2021-09-19 LAB — IRON,TIBC AND FERRITIN PANEL
%SAT: 22 % (calc) (ref 16–45)
Ferritin: 14 ng/mL — ABNORMAL LOW (ref 16–288)
Iron: 71 ug/dL (ref 45–160)
TIBC: 322 mcg/dL (calc) (ref 250–450)

## 2021-09-19 LAB — CBC WITH DIFFERENTIAL/PLATELET
Absolute Monocytes: 272 cells/uL (ref 200–950)
Basophils Absolute: 31 cells/uL (ref 0–200)
Basophils Relative: 0.9 %
Eosinophils Absolute: 92 cells/uL (ref 15–500)
Eosinophils Relative: 2.7 %
HCT: 38.6 % (ref 35.0–45.0)
Hemoglobin: 12.7 g/dL (ref 11.7–15.5)
Lymphs Abs: 1044 cells/uL (ref 850–3900)
MCH: 27.6 pg (ref 27.0–33.0)
MCHC: 32.9 g/dL (ref 32.0–36.0)
MCV: 83.9 fL (ref 80.0–100.0)
MPV: 10.1 fL (ref 7.5–12.5)
Monocytes Relative: 8 %
Neutro Abs: 1962 cells/uL (ref 1500–7800)
Neutrophils Relative %: 57.7 %
Platelets: 286 10*3/uL (ref 140–400)
RBC: 4.6 10*6/uL (ref 3.80–5.10)
RDW: 14.4 % (ref 11.0–15.0)
Total Lymphocyte: 30.7 %
WBC: 3.4 10*3/uL — ABNORMAL LOW (ref 3.8–10.8)

## 2021-09-19 LAB — LIPID PANEL
Cholesterol: 216 mg/dL — ABNORMAL HIGH (ref <200)
HDL: 51 mg/dL (ref >=50)
LDL Cholesterol (Calc): 145 mg/dL (calc) — ABNORMAL HIGH
Non-HDL Cholesterol (Calc): 165 mg/dL (calc) — ABNORMAL HIGH (ref <130)
Total CHOL/HDL Ratio: 4.2 (calc) (ref <5.0)
Triglycerides: 92 mg/dL (ref <150)

## 2021-09-19 LAB — HEMOGLOBIN A1C
Hgb A1c MFr Bld: 6.2 % of total Hgb — ABNORMAL HIGH (ref <5.7)
Mean Plasma Glucose: 131 mg/dL
eAG (mmol/L): 7.3 mmol/L

## 2021-09-22 NOTE — Progress Notes (Signed)
Name: Madeline Pitts   MRN: [id number]    DOB: 1950/12/02   Date:09/23/2021 ? ?     Progress Note ? ?Subjective ? ?Chief Complaint ? ?Follow up  ? ?HPI ? ?HTN: she has been taking medication as prescribed, and denies side effects. No chest pain, dizziness  or palpitation. BP was 138/82 last visit and today was 150/72 but she had a stressful morning. We will recheck before she goes home.  ?  ?Leucopenia: stable over time, reviewed labs today  ?  ?Morbid obeisty:  she has HTN and hyperglycemia and dyslipidemia with a BMI above 35. She no longer drinks sodas, eating more vegetables and fruit , she lost another 3 lbs since last visit  ? ?Vitamin D deficiency: taking otc supplementation , currently her MVI has 2000 units of vitamin D on it  ?  ?Pre-diabetes: last hgbA1C up from 6.1 % to 6.2 %   She denies polydipsia, polyuria or polyphagia.  ? ?Hyperlipidemia: based on calculation below I recommended patient to start taking statins  on her last visit , but she has not started yet. Discussed risk below  ? ?The 10-year ASCVD risk score (Arnett DK, et al., 2019) is: 14.3% ?  Values used to calculate the score: ?    Age: 71 years ?    Sex: Female ?    Is Non-Hispanic African American: Yes ?    Diabetic: No ?    Tobacco smoker: No ?    Systolic Blood Pressure: 127 mmHg ?    Is BP treated: Yes ?    HDL Cholesterol: 51 mg/dL ?    Total Cholesterol: 216 mg/dL  ? ?Iron deficiency anemia: it happened during COVID-19 Summer 2022 , she has been taking iron supplementation and last level improved, colonoscopy up to date , she did two rounds of hemoccults stools, both negative. Today she told she eats mostly fish and chicken as protein sources, used to eat liver once a week but stopped about one year ago when anemia started. She stopped taking supplements over the past few months . She will try resuming eating liver and add more red meat. Take iron supplementation, seems to be decrease in intake of iron not due to iron loss.   ? ?Enlarge thyroid and parathyroid: under the care of Dr. Pryor Ochoa, no symptoms  ? ?Patient Active Problem List  ? Diagnosis Date Noted  ? Morbid obesity ([place]) 09/23/2021  ? Thyroid enlargement 09/23/2021  ? Enlarged parotid gland 09/23/2021  ? Iron deficiency 09/23/2021  ? Hyperglycemia 09/23/2021  ? Other neutropenia ([place]) 06/24/2021  ? Pre-diabetes 08/26/2016  ? Dyslipidemia 08/26/2016  ? Fibroid 04/25/2015  ? Essential hypertension 04/25/2015  ? Menopausal symptom 09/08/2012  ? Adult BMI 30+ 12/16/2010  ? ? ?Past Surgical History:  ?Procedure Laterality Date  ? BREAST BIOPSY Right 09/26/2020  ? stereo biopsy/ coil clip/ benign  ? BREAST BIOPSY Right 09/26/2020  ? stereo biopsy/ coil clip/ benign  ? COLONOSCOPY  2009?  ? Dr Alveta Heimlich  ? COLONOSCOPY WITH PROPOFOL N/A 10/06/2017  ? Procedure: COLONOSCOPY WITH PROPOFOL;  Surgeon: Robert Bellow, MD;  Location: [facility] ENDOSCOPY;  Service: Endoscopy;  Laterality: N/A;  ? ENDOMETRIAL ABLATION    ? ? ?Family History  ?Problem Relation Age of Onset  ? Uterine cancer Mother   ? Gout Father   ? COPD Father   ? Emphysema Father   ?     Was a heavy smoker  ? Lung cancer Brother   ?  smoker  ? Lung cancer Brother   ? Breast cancer Neg Hx   ? ? ?Social History  ? ?Tobacco Use  ? Smoking status: Never  ? Smokeless tobacco: Never  ?Substance Use Topics  ? Alcohol use: No  ?  Alcohol/week: 0.0 standard drinks  ? ? ? ?Current Outpatient Medications:  ?  ammonium lactate (AMLACTIN) 12 % lotion, Apply 1 application topically as needed for dry skin., Disp: 400 g, Rfl: 3 ?  Ginseng 250 MG CAPS, Take by mouth., Disp: , Rfl:  ?  GLUCOSAMINE SULFATE-MSM PO, Take 2 tablets by mouth once., Disp: , Rfl:  ?  hydrocortisone 2.5 % cream, Apply topically 2 (two) times daily., Disp: 30 g, Rfl: 0 ?  Iron, Ferrous Sulfate, 325 (65 Fe) MG TABS, Take 325 mg by mouth daily., Disp: 30 tablet, Rfl: 5 ?  losartan-hydrochlorothiazide (HYZAAR) 50-12.5 MG tablet, Take 1 tablet by mouth daily., Disp: 90  tablet, Rfl: 1 ?  Multiple Vitamin (MULTI-VITAMINS) TABS, Take by mouth., Disp: , Rfl:  ?  Omega-3 Fatty Acids (FISH OIL) 1000 MG CAPS, Take 1 capsule by mouth 3 (three) times a week., Disp: , Rfl:  ?  PREMPRO 0.3-1.5 MG tablet, Take 1 tablet by mouth daily., Disp: , Rfl:  ?  TURMERIC PO, Take by mouth., Disp: , Rfl:  ?  zinc gluconate 50 MG tablet, Take 50 mg by mouth 2 (two) times a week., Disp: , Rfl:  ?  atorvastatin (LIPITOR) 20 MG tablet, Take 1 tablet (20 mg total) by mouth daily. (Patient not taking: Reported on 09/23/2021), Disp: 90 tablet, Rfl: 1 ? ?Allergies  ?Allergen Reactions  ? Codeine Nausea Only and Other (See Comments)  ?  Makes patient feel lethargic-can't move  ? Penicillins Nausea Only  ? ? ?I personally reviewed active problem list, medication list, allergies, family history, social history with the patient/caregiver today. ? ? ?ROS ? ?Constitutional: Negative for fever or weight change.  ?Respiratory: Negative for cough and shortness of breath.   ?Cardiovascular: Negative for chest pain or palpitations.  ?Gastrointestinal: Negative for abdominal pain, no bowel changes.  ?Musculoskeletal: Negative for gait problem or joint swelling.  ?Skin: Negative for rash.  ?Neurological: Negative for dizziness or headache.  ?No other specific complaints in a complete review of systems (except as listed in HPI above).  ? ?Objective ? ?Vitals:  ? 09/23/21 1044 09/23/21 1115  ?BP: (!) 150/72 (!) 142/76  ?Pulse: 93   ?Resp: 16   ?Temp: 98.6 ?F (37 ?C)   ?TempSrc: Oral   ?SpO2: 96%   ?Weight: 194 lb 9.6 oz (88.3 kg)   ?Height: '5\' 2"'  (1.575 m)   ? ? ?Body mass index is 35.59 kg/m?. ? ?Physical Exam ? ?Constitutional: Patient appears well-developed and well-nourished. Obese  No distress.  ?HEENT: head atraumatic, normocephalic, pupils equal and reactive to light, neck supple ?Cardiovascular: Normal rate, regular rhythm and normal heart sounds.  No murmur heard. No BLE edema. ?Pulmonary/Chest: Effort normal and  breath sounds normal. No respiratory distress. ?Abdominal: Soft.  There is no tenderness. ?Psychiatric: Patient has a normal mood and affect. behavior is normal. Judgment and thought content normal.  ? ?Recent Results (from the past 2160 hour(s))  ?CBC with Differential/Platelet     Status: Abnormal  ? Collection Time: 09/18/21 11:44 AM  ?Result Value Ref Range  ? WBC 3.4 (L) 3.8 - 10.8 Thousand/uL  ? RBC 4.60 3.80 - 5.10 Million/uL  ? Hemoglobin 12.7 11.7 - 15.5 g/dL  ? HCT  38.6 35.0 - 45.0 %  ? MCV 83.9 80.0 - 100.0 fL  ? MCH 27.6 27.0 - 33.0 pg  ? MCHC 32.9 32.0 - 36.0 g/dL  ? RDW 14.4 11.0 - 15.0 %  ? Platelets 286 140 - 400 Thousand/uL  ? MPV 10.1 7.5 - 12.5 fL  ? Neutro Abs 1,962 1,500 - 7,800 cells/uL  ? Lymphs Abs 1,044 850 - 3,900 cells/uL  ? Absolute Monocytes 272 200 - 950 cells/uL  ? Eosinophils Absolute 92 15 - 500 cells/uL  ? Basophils Absolute 31 0 - 200 cells/uL  ? Neutrophils Relative % 57.7 %  ? Total Lymphocyte 30.7 %  ? Monocytes Relative 8.0 %  ? Eosinophils Relative 2.7 %  ? Basophils Relative 0.9 %  ?COMPLETE METABOLIC PANEL WITH GFR     Status: None  ? Collection Time: 09/18/21 11:44 AM  ?Result Value Ref Range  ? Glucose, Bld 99 65 - 99 mg/dL  ?  Comment: . ?           Fasting reference interval ?. ?  ? BUN 15 7 - 25 mg/dL  ? Creat 0.94 0.50 - 1.05 mg/dL  ? eGFR 66 > OR = 60 mL/min/1.19m  ?  Comment: The eGFR is based on the CKD-EPI 2021 equation. To calculate  ?the new eGFR from a previous Creatinine or Cystatin C ?result, go to https://www.kidney.org/professionals/ ?kdoqi/gfr%5Fcalculator ?  ? BUN/Creatinine Ratio NOT APPLICABLE 6 - 22 (calc)  ? Sodium 141 135 - 146 mmol/L  ? Potassium 4.3 3.5 - 5.3 mmol/L  ? Chloride 103 98 - 110 mmol/L  ? CO2 30 20 - 32 mmol/L  ? Calcium 9.7 8.6 - 10.4 mg/dL  ? Total Protein 6.5 6.1 - 8.1 g/dL  ? Albumin 4.2 3.6 - 5.1 g/dL  ? Globulin 2.3 1.9 - 3.7 g/dL (calc)  ? AG Ratio 1.8 1.0 - 2.5 (calc)  ? Total Bilirubin 0.4 0.2 - 1.2 mg/dL  ? Alkaline  phosphatase (APISO) 77 37 - 153 U/L  ? AST 15 10 - 35 U/L  ? ALT 16 6 - 29 U/L  ?Lipid panel     Status: Abnormal  ? Collection Time: 09/18/21 11:44 AM  ?Result Value Ref Range  ? Cholesterol 216 (H) <200 mg/dL  ? HDL 51 >

## 2021-09-23 ENCOUNTER — Ambulatory Visit (INDEPENDENT_AMBULATORY_CARE_PROVIDER_SITE_OTHER): Payer: PPO | Admitting: Family Medicine

## 2021-09-23 ENCOUNTER — Encounter: Payer: Self-pay | Admitting: Family Medicine

## 2021-09-23 DIAGNOSIS — I10 Essential (primary) hypertension: Secondary | ICD-10-CM | POA: Diagnosis not present

## 2021-09-23 DIAGNOSIS — R739 Hyperglycemia, unspecified: Secondary | ICD-10-CM

## 2021-09-23 DIAGNOSIS — E611 Iron deficiency: Secondary | ICD-10-CM

## 2021-09-23 DIAGNOSIS — E049 Nontoxic goiter, unspecified: Secondary | ICD-10-CM

## 2021-09-23 DIAGNOSIS — D708 Other neutropenia: Secondary | ICD-10-CM

## 2021-09-23 DIAGNOSIS — Z1231 Encounter for screening mammogram for malignant neoplasm of breast: Secondary | ICD-10-CM

## 2021-09-23 DIAGNOSIS — K111 Hypertrophy of salivary gland: Secondary | ICD-10-CM | POA: Diagnosis not present

## 2021-09-23 DIAGNOSIS — E785 Hyperlipidemia, unspecified: Secondary | ICD-10-CM | POA: Diagnosis not present

## 2021-09-23 DIAGNOSIS — R7303 Prediabetes: Secondary | ICD-10-CM | POA: Diagnosis not present

## 2021-09-23 NOTE — Assessment & Plan Note (Signed)
A1C 6.2

## 2021-09-23 NOTE — Assessment & Plan Note (Signed)
Stable over the years  ?

## 2021-09-23 NOTE — Assessment & Plan Note (Signed)
She will try starting Atorvastatin  ?

## 2021-09-23 NOTE — Assessment & Plan Note (Signed)
She will increase iron intake  ?

## 2021-09-23 NOTE — Assessment & Plan Note (Signed)
Gradually losing weight  ?

## 2021-09-23 NOTE — Assessment & Plan Note (Signed)
Under the care of Dr. Pryor Ochoa ?

## 2021-10-22 ENCOUNTER — Ambulatory Visit: Payer: PPO

## 2021-11-11 ENCOUNTER — Other Ambulatory Visit: Payer: Self-pay | Admitting: Family Medicine

## 2021-11-11 DIAGNOSIS — L2489 Irritant contact dermatitis due to other agents: Secondary | ICD-10-CM

## 2021-12-08 NOTE — Progress Notes (Unsigned)
Name: Madeline Pitts   MRN: [id number]    DOB: [birth year]   Date:12/09/2021       Progress Note  Subjective  Chief Complaint  Annual Exam  HPI  Patient presents for annual CPE.  Diet: balanced, tried to increase iron  rich foods like liver  Exercise: continue regular physical activity    Eye exam: she is due for repeat and will schedule it  Dental exam: every 6 months   Flowsheet Row Clinical Support from 06/12/2021 in [facility]  AUDIT-C Score 0      Depression: Phq 9 is  negative    12/09/2021   11:35 AM 09/23/2021   10:46 AM 06/24/2021   11:00 AM 06/12/2021   11:39 AM 03/06/2021   10:20 AM  Depression screen PHQ 2/9  Decreased Interest 0 0 0 0 0  Down, Depressed, Hopeless 0 0 0 0 0  PHQ - 2 Score 0 0 0 0 0  Altered sleeping 0  0  0  Tired, decreased energy 0  0  0  Change in appetite 0  0  0  Feeling bad or failure about yourself  0  0  0  Trouble concentrating 0  0  0  Moving slowly or fidgety/restless 0  0  0  Suicidal thoughts 0  0  0  PHQ-9 Score 0  0  0   Hypertension: BP Readings from Last 3 Encounters:  12/09/21 120/62  09/23/21 (!) 142/76  06/24/21 138/82   Obesity: Wt Readings from Last 3 Encounters:  12/09/21 195 lb (88.5 kg)  09/23/21 194 lb 9.6 oz (88.3 kg)  06/24/21 197 lb (89.4 kg)   BMI Readings from Last 3 Encounters:  12/09/21 35.67 kg/m  09/23/21 35.59 kg/m  06/24/21 36.03 kg/m     Vaccines:   HPV: N/A Tdap: up to date Shingrix: N/A Pneumonia: up to date Flu: up to date COVID-15: up to date   Hep C Screening: 08/21/16 STD testing and prevention (HIV/chl/gon/syphilis): N/A Intimate partner violence: negative screen  Sexual History : one partner, no pain , discomfort or dryness, using lubricants and it has helped  Menstrual History/LMP/Abnormal Bleeding: pos-menopause  Discussed importance of follow up if any post-menopausal bleeding: yes  Incontinence Symptoms: negative for symptoms   Breast  cancer:  - Last Mammogram: Today - results  - BRCA gene screening: N/A  Osteoporosis Prevention : Discussed high calcium and vitamin D supplementation, weight bearing exercises Bone density: 03/28/19 and normal repeat in 2025   Cervical cancer screening: N/A  Skin cancer: Discussed monitoring for atypical lesions  Colorectal cancer: 10/02/17  , repeat colonoscopy in 2024  Lung cancer:  Low Dose CT Chest recommended if Age 24-80 years, 20 pack-year currently smoking OR have quit w/in 15years. Patient does not qualify for screen   ECG: 03/04/21  Advanced Care Planning: A voluntary discussion about advance care planning including the explanation and discussion of advance directives.  Discussed health care proxy and Living will, and the patient was able to identify a health care proxy as husband.  Patient does not have a living will and power of attorney of health care   Lipids: Lab Results  Component Value Date   CHOL 216 (H) 09/18/2021   CHOL 225 (H) 12/03/2020   CHOL 243 (H) 12/09/2018   Lab Results  Component Value Date   HDL 51 09/18/2021   HDL 51 12/03/2020   HDL 50 12/09/2018   Lab Results  Component Value Date  LDLCALC 145 (H) 09/18/2021   LDLCALC 148 (H) 12/03/2020   LDLCALC 168 (H) 12/09/2018   Lab Results  Component Value Date   TRIG 92 09/18/2021   TRIG 139 12/03/2020   TRIG 118 12/09/2018   Lab Results  Component Value Date   CHOLHDL 4.2 09/18/2021   CHOLHDL 4.4 12/03/2020   CHOLHDL 4.9 12/09/2018   No results found for: "LDLDIRECT"  Glucose: Glucose, Bld  Date Value Ref Range Status  09/18/2021 99 65 - 99 mg/dL Final    Comment:    .            Fasting reference interval .   03/03/2021 151 (H) 70 - 99 mg/dL Final    Comment:    Glucose reference range applies only to samples taken after fasting for at least 8 hours.  12/03/2020 91 65 - 99 mg/dL Final    Comment:    .            Fasting reference interval .     Patient Active Problem List    Diagnosis Date Noted   Morbid obesity ([place]) 09/23/2021   Thyroid enlargement 09/23/2021   Enlarged parotid gland 09/23/2021   Iron deficiency 09/23/2021   Hyperglycemia 09/23/2021   Other neutropenia ([place]) 06/24/2021   Pre-diabetes 08/26/2016   Dyslipidemia 08/26/2016   Fibroid 04/25/2015   Essential hypertension 04/25/2015   Menopausal symptom 09/08/2012   Adult BMI 30+ 12/16/2010    Past Surgical History:  Procedure Laterality Date   BREAST BIOPSY Right 09/26/2020   stereo biopsy/ coil clip/ benign   BREAST BIOPSY Right 09/26/2020   stereo biopsy/ coil clip/ benign   COLONOSCOPY  2009?   Dr Alveta Heimlich   COLONOSCOPY WITH PROPOFOL N/A 10/06/2017   Procedure: COLONOSCOPY WITH PROPOFOL;  Surgeon: Robert Bellow, MD;  Location: [facility] ENDOSCOPY;  Service: Endoscopy;  Laterality: N/A;   ENDOMETRIAL ABLATION      Family History  Problem Relation Age of Onset   Uterine cancer Mother    Gout Father    COPD Father    Emphysema Father        Was a heavy smoker   Lung cancer Brother        smoker   Lung cancer Brother    Breast cancer Neg Hx     Social History   Socioeconomic History   Marital status: Married    Spouse name: Karle Starch    Number of children: 2   Years of education: Not on file   Highest education level: Bachelor's degree (e.g., BA, AB, BS)  Occupational History   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   Tobacco Use   Smoking status: Never   Smokeless tobacco: Never  Vaping Use   Vaping Use: Never used  Substance and Sexual Activity   Alcohol use: No    Alcohol/week: 0.0 standard drinks of alcohol   Drug use: No   Sexual activity: Yes    Partners: Male    Birth control/protection: Post-menopausal  Other Topics Concern   Not on file  Social History Narrative   Not on file   Social Determinants of Health   Financial Resource Strain: Low Risk  (12/09/2021)   Overall Financial Resource Strain (CARDIA)    Difficulty of Paying Living Expenses:  Not hard at all  Food Insecurity: No Food Insecurity (12/09/2021)   Hunger Vital Sign    Worried About Running Out of Food in the Last Year: Never true    Ran Out  of Food in the Last Year: Never true  Transportation Needs: No Transportation Needs (12/09/2021)   PRAPARE - Hydrologist (Medical): No    Lack of Transportation (Non-Medical): No  Physical Activity: Sufficiently Active (12/09/2021)   Exercise Vital Sign    Days of Exercise per Week: 4 days    Minutes of Exercise per Session: 40 min  Stress: No Stress Concern Present (12/09/2021)   [place]    Feeling of Stress : Not at all  Social Connections: [place] (12/09/2021)   Social Connection and Isolation Panel [NHANES]    Frequency of Communication with Friends and Family: More than three times a week    Frequency of Social Gatherings with Friends and Family: More than three times a week    Attends Religious Services: More than 4 times per year    Active Member of [employer] or Organizations: Yes    Attends Music therapist: More than 4 times per year    Marital Status: Married  Human resources officer Violence: Not At Risk (06/12/2021)   Humiliation, Afraid, Rape, and Kick questionnaire    Fear of Current or Ex-Partner: No    Emotionally Abused: No    Physically Abused: No    Sexually Abused: No     Current Outpatient Medications:    ammonium lactate (AMLACTIN) 12 % lotion, Apply 1 application topically as needed for dry skin., Disp: 400 g, Rfl: 3   atorvastatin (LIPITOR) 20 MG tablet, Take 1 tablet (20 mg total) by mouth daily., Disp: 90 tablet, Rfl: 1   Ginseng 250 MG CAPS, Take by mouth., Disp: , Rfl:    GLUCOSAMINE SULFATE-MSM PO, Take 2 tablets by mouth once., Disp: , Rfl:    hydrocortisone 2.5 % cream, APPLY TOPICALLY TWICE A DAY, Disp: 28 g, Rfl: 0   Iron, Ferrous Sulfate, 325 (65 Fe) MG TABS, Take 325 mg by mouth  daily., Disp: 30 tablet, Rfl: 5   losartan-hydrochlorothiazide (HYZAAR) 50-12.5 MG tablet, Take 1 tablet by mouth daily., Disp: 90 tablet, Rfl: 1   Multiple Vitamin (MULTI-VITAMINS) TABS, Take by mouth., Disp: , Rfl:    Omega-3 Fatty Acids (FISH OIL) 1000 MG CAPS, Take 1 capsule by mouth 3 (three) times a week., Disp: , Rfl:    PREMPRO 0.3-1.5 MG tablet, Take 1 tablet by mouth daily., Disp: , Rfl:    TURMERIC PO, Take by mouth., Disp: , Rfl:    zinc gluconate 50 MG tablet, Take 50 mg by mouth 2 (two) times a week., Disp: , Rfl:   Allergies  Allergen Reactions   Codeine Nausea Only and Other (See Comments)    Makes patient feel lethargic-can't move   Penicillins Nausea Only     ROS  Constitutional: Negative for fever or weight change.  Respiratory: Negative for cough and shortness of breath.   Cardiovascular: Negative for chest pain or palpitations.  Gastrointestinal: Negative for abdominal pain, no bowel changes.  Musculoskeletal: Negative for gait problem or joint swelling.  Skin: Negative for rash.  Neurological: Negative for dizziness or headache.  No other specific complaints in a complete review of systems (except as listed in HPI above).   Objective  Vitals:   12/09/21 1136  BP: 120/62  Pulse: 88  Resp: 16  SpO2: 98%  Weight: 195 lb (88.5 kg)  Height: 5' 2" (1.575 m)    Body mass index is 35.67 kg/m.  Physical Exam  Constitutional: Patient  appears well-developed and  obese . No distress.  HENT: Head: Normocephalic and atraumatic. Ears: B TMs ok, no erythema or effusion; Nose: Nose normal. Mouth/Throat: Oropharynx is clear and moist. No oropharyngeal exudate.  Eyes: Conjunctivae and EOM are normal. Pupils are equal, round, and reactive to light. No scleral icterus.  Neck: Normal range of motion. Neck supple. No JVD present. No thyromegaly present.  Cardiovascular: Normal rate, regular rhythm and normal heart sounds.  No murmur heard. No BLE  edema. Pulmonary/Chest: Effort normal and breath sounds normal. No respiratory distress. Abdominal: Soft. Bowel sounds are normal, no distension. There is no tenderness. no masses Breast: no lumps or masses, no nipple discharge or rashes FEMALE GENITALIA:  Not done  RECTAL: not done  Musculoskeletal: Normal range of motion, no joint effusions. No gross deformities Neurological: he is alert and oriented to person, place, and time. No cranial nerve deficit. Coordination, balance, strength, speech and gait are normal.  Skin: Skin is warm and dry. No rash noted. No erythema.  Psychiatric: Patient has a normal mood and affect. behavior is normal. Judgment and thought content normal.   Recent Results (from the past 2160 hour(s))  CBC with Differential/Platelet     Status: Abnormal   Collection Time: 09/18/21 11:44 AM  Result Value Ref Range   WBC 3.4 (L) 3.8 - 10.8 Thousand/uL   RBC 4.60 3.80 - 5.10 Million/uL   Hemoglobin 12.7 11.7 - 15.5 g/dL   HCT 38.6 35.0 - 45.0 %   MCV 83.9 80.0 - 100.0 fL   MCH 27.6 27.0 - 33.0 pg   MCHC 32.9 32.0 - 36.0 g/dL   RDW 14.4 11.0 - 15.0 %   Platelets 286 140 - 400 Thousand/uL   MPV 10.1 7.5 - 12.5 fL   Neutro Abs 1,962 1,500 - 7,800 cells/uL   Lymphs Abs 1,044 850 - 3,900 cells/uL   Absolute Monocytes 272 200 - 950 cells/uL   Eosinophils Absolute 92 15 - 500 cells/uL   Basophils Absolute 31 0 - 200 cells/uL   Neutrophils Relative % 57.7 %   Total Lymphocyte 30.7 %   Monocytes Relative 8.0 %   Eosinophils Relative 2.7 %   Basophils Relative 0.9 %  COMPLETE METABOLIC PANEL WITH GFR     Status: None   Collection Time: 09/18/21 11:44 AM  Result Value Ref Range   Glucose, Bld 99 65 - 99 mg/dL    Comment: .            Fasting reference interval .    BUN 15 7 - 25 mg/dL   Creat 0.94 0.50 - 1.05 mg/dL   eGFR 66 > OR = 60 mL/min/1.43m    Comment: The eGFR is based on the CKD-EPI 2021 equation. To calculate  the new eGFR from a previous Creatinine  or Cystatin C result, go to https://www.kidney.org/professionals/ kdoqi/gfr%5Fcalculator    BUN/Creatinine Ratio NOT APPLICABLE 6 - 22 (calc)   Sodium 141 135 - 146 mmol/L   Potassium 4.3 3.5 - 5.3 mmol/L   Chloride 103 98 - 110 mmol/L   CO2 30 20 - 32 mmol/L   Calcium 9.7 8.6 - 10.4 mg/dL   Total Protein 6.5 6.1 - 8.1 g/dL   Albumin 4.2 3.6 - 5.1 g/dL   Globulin 2.3 1.9 - 3.7 g/dL (calc)   AG Ratio 1.8 1.0 - 2.5 (calc)   Total Bilirubin 0.4 0.2 - 1.2 mg/dL   Alkaline phosphatase (APISO) 77 37 - 153 U/L   AST 15 10 -  35 U/L   ALT 16 6 - 29 U/L  Lipid panel     Status: Abnormal   Collection Time: 09/18/21 11:44 AM  Result Value Ref Range   Cholesterol 216 (H) <200 mg/dL   HDL 51 > OR = 50 mg/dL   Triglycerides 92 <150 mg/dL   LDL Cholesterol (Calc) 145 (H) mg/dL (calc)    Comment: Reference range: <100 . Desirable range <100 mg/dL for primary prevention;   <70 mg/dL for patients with CHD or diabetic patients  with > or = 2 CHD risk factors. Marland Kitchen LDL-C is now calculated using the Martin-Hopkins  calculation, which is a validated novel method providing  better accuracy than the Friedewald equation in the  estimation of LDL-C.  Cresenciano Genre et al. Annamaria Helling. 0938;182(99): 2061-2068  (http://education.[facility].com/faq/FAQ164)    Total CHOL/HDL Ratio 4.2 <5.0 (calc)   Non-HDL Cholesterol (Calc) 165 (H) <130 mg/dL (calc)    Comment: For patients with diabetes plus 1 major ASCVD risk  factor, treating to a non-HDL-C goal of <100 mg/dL  (LDL-C of <70 mg/dL) is considered a therapeutic  option.   Hemoglobin A1c     Status: Abnormal   Collection Time: 09/18/21 11:44 AM  Result Value Ref Range   Hgb A1c MFr Bld 6.2 (H) <5.7 % of total Hgb    Comment: For someone without known diabetes, a hemoglobin  A1c value between 5.7% and 6.4% is consistent with prediabetes and should be confirmed with a  follow-up test. . For someone with known diabetes, a value <7% indicates that their  diabetes is well controlled. A1c targets should be individualized based on duration of diabetes, age, comorbid conditions, and other considerations. . This assay result is consistent with an increased risk of diabetes. . Currently, no consensus exists regarding use of hemoglobin A1c for diagnosis of diabetes for children. .    Mean Plasma Glucose 131 mg/dL   eAG (mmol/L) 7.3 mmol/L  Iron, TIBC and Ferritin Panel     Status: Abnormal   Collection Time: 09/18/21 11:44 AM  Result Value Ref Range   Iron 71 45 - 160 mcg/dL   TIBC 322 250 - 450 mcg/dL (calc)   %SAT 22 16 - 45 % (calc)   Ferritin 14 (L) 16 - 288 ng/mL  POC Hemoccult Bld/Stl (3-Cd Home Screen)     Status: None   Collection Time: 09/18/21 11:56 AM  Result Value Ref Range   Card #1 Date [id number]    Fecal Occult Blood, POC Negative Negative   Card #2 Date [id number]    Card #2 Fecal Occult Blod, POC Negative    Card #3 Date 5112023    Card #3 Fecal Occult Blood, POC Negative      Fall Risk:    12/09/2021   11:35 AM 09/23/2021   10:38 AM 06/24/2021   11:00 AM 06/12/2021   11:40 AM 03/06/2021   10:19 AM  Fall Risk   Falls in the past year? 0 0 0 0 0  Number falls in past yr: 0  0 0   Injury with Fall? 0  0 0   Risk for fall due to : No Fall Risks No Fall Risks No Fall Risks No Fall Risks   Follow up _0      Functional Status Survey: Is the patient deaf or have difficulty hearing?: No Does the patient have difficulty seeing, even when wearing glasses/contacts?: No Does  the patient have difficulty concentrating, remembering, or making decisions?: No Does the patient have difficulty walking or climbing stairs?: No Does the patient have difficulty dressing or bathing?: No Does the patient have difficulty doing errands alone such as visiting a doctor's office or shopping?: No   Assessment &  Plan  1. Well adult exam   Discussed healthy diet.   -USPSTF grade A and B recommendations reviewed with patient; age-appropriate recommendations, preventive care, screening tests, etc discussed and encouraged; healthy living encouraged; see AVS for patient education given to patient -Discussed importance of 150 minutes of physical activity weekly, eat two servings of fish weekly, eat one serving of tree nuts ( cashews, pistachios, pecans, almonds.Marland Kitchen) every other day, eat 6 servings of fruit/vegetables daily and drink plenty of water and avoid sweet beverages.   -Reviewed Health Maintenance: Yes.

## 2021-12-08 NOTE — Patient Instructions (Signed)
Preventive Care 65 Years and Older, Female Preventive care refers to lifestyle choices and visits with your health care provider that can promote health and wellness. Preventive care visits are also called wellness exams. What can I expect for my preventive care visit? Counseling Your health care provider may ask you questions about your: Medical history, including: Past medical problems. Family medical history. Pregnancy and menstrual history. History of falls. Current health, including: Memory and ability to understand (cognition). Emotional well-being. Home life and relationship well-being. Sexual activity and sexual health. Lifestyle, including: Alcohol, nicotine or tobacco, and drug use. Access to firearms. Diet, exercise, and sleep habits. Work and work environment. Sunscreen use. Safety issues such as seatbelt and bike helmet use. Physical exam Your health care provider will check your: Height and weight. These may be used to calculate your BMI (body mass index). BMI is a measurement that tells if you are at a healthy weight. Waist circumference. This measures the distance around your waistline. This measurement also tells if you are at a healthy weight and may help predict your risk of certain diseases, such as type 2 diabetes and high blood pressure. Heart rate and blood pressure. Body temperature. Skin for abnormal spots. What immunizations do I need?  Vaccines are usually given at various ages, according to a schedule. Your health care provider will recommend vaccines for you based on your age, medical history, and lifestyle or other factors, such as travel or where you work. What tests do I need? Screening Your health care provider may recommend screening tests for certain conditions. This may include: Lipid and cholesterol levels. Hepatitis C test. Hepatitis B test. HIV (human immunodeficiency virus) test. STI (sexually transmitted infection) testing, if you are at  risk. Lung cancer screening. Colorectal cancer screening. Diabetes screening. This is done by checking your blood sugar (glucose) after you have not eaten for a while (fasting). Mammogram. Talk with your health care provider about how often you should have regular mammograms. BRCA-related cancer screening. This may be done if you have a family history of breast, ovarian, tubal, or peritoneal cancers. Bone density scan. This is done to screen for osteoporosis. Talk with your health care provider about your test results, treatment options, and if necessary, the need for more tests. Follow these instructions at home: Eating and drinking  Eat a diet that includes fresh fruits and vegetables, whole grains, lean protein, and low-fat dairy products. Limit your intake of foods with high amounts of sugar, saturated fats, and salt. Take vitamin and mineral supplements as recommended by your health care provider. Do not drink alcohol if your health care provider tells you not to drink. If you drink alcohol: Limit how much you have to 0-1 drink a day. Know how much alcohol is in your drink. In the [place] one drink equals one 12 oz bottle of beer (355 mL), one 5 oz glass of wine (148 mL), or one 1 oz glass of hard liquor (44 mL). Lifestyle Brush your teeth every morning and night with fluoride toothpaste. Floss one time each day. Exercise for at least 30 minutes 5 or more days each week. Do not use any products that contain nicotine or tobacco. These products include cigarettes, chewing tobacco, and vaping devices, such as e-cigarettes. If you need help quitting, ask your health care provider. Do not use drugs. If you are sexually active, practice safe sex. Use a condom or other form of protection in order to prevent STIs. Take aspirin only as told by   your health care provider. Make sure that you understand how much to take and what form to take. Work with your health care provider to find out whether it  is safe and beneficial for you to take aspirin daily. Ask your health care provider if you need to take a cholesterol-lowering medicine (statin). Find healthy ways to manage stress, such as: Meditation, yoga, or listening to music. Journaling. Talking to a trusted person. Spending time with friends and family. Minimize exposure to UV radiation to reduce your risk of skin cancer. Safety Always wear your seat belt while driving or riding in a vehicle. Do not drive: If you have been drinking alcohol. Do not ride with someone who has been drinking. When you are tired or distracted. While texting. If you have been using any mind-altering substances or drugs. Wear a helmet and other protective equipment during sports activities. If you have firearms in your house, make sure you follow all gun safety procedures. What's next? Visit your health care provider once a year for an annual wellness visit. Ask your health care provider how often you should have your eyes and teeth checked. Stay up to date on all vaccines. This information is not intended to replace advice given to you by your health care provider. Make sure you discuss any questions you have with your health care provider. Document Revised: 10/23/2020 Document Reviewed: 10/23/2020 Elsevier Patient Education  2023 [employer]  

## 2021-12-09 ENCOUNTER — Encounter: Payer: Self-pay | Admitting: Family Medicine

## 2021-12-09 ENCOUNTER — Ambulatory Visit (INDEPENDENT_AMBULATORY_CARE_PROVIDER_SITE_OTHER): Payer: PPO | Admitting: Family Medicine

## 2021-12-09 ENCOUNTER — Ambulatory Visit
Admission: RE | Admit: 2021-12-09 | Discharge: 2021-12-09 | Disposition: A | Discharge status: Home / Self Care | Payer: PPO | Source: Ambulatory Visit | Attending: Family Medicine | Admitting: Family Medicine

## 2021-12-09 VITALS — BP 120/62 | HR 88 | Resp 16 | Ht 62.0 in | Wt 195.0 lb

## 2021-12-09 DIAGNOSIS — Z1231 Encounter for screening mammogram for malignant neoplasm of breast: Secondary | ICD-10-CM | POA: Diagnosis not present

## 2021-12-09 DIAGNOSIS — Z0000 Encounter for general adult medical examination without abnormal findings: Secondary | ICD-10-CM | POA: Diagnosis not present

## 2021-12-10 ENCOUNTER — Other Ambulatory Visit: Payer: Self-pay

## 2021-12-10 ENCOUNTER — Other Ambulatory Visit: Payer: Self-pay | Admitting: Family Medicine

## 2021-12-10 DIAGNOSIS — N6489 Other specified disorders of breast: Secondary | ICD-10-CM

## 2021-12-10 DIAGNOSIS — R928 Other abnormal and inconclusive findings on diagnostic imaging of breast: Secondary | ICD-10-CM

## 2021-12-17 ENCOUNTER — Ambulatory Visit
Admission: RE | Admit: 2021-12-17 | Discharge: 2021-12-17 | Disposition: A | Discharge status: Home / Self Care | Payer: PPO | Source: Ambulatory Visit | Attending: Family Medicine | Admitting: Family Medicine

## 2021-12-17 ENCOUNTER — Ambulatory Visit: Payer: PPO

## 2021-12-17 DIAGNOSIS — R928 Other abnormal and inconclusive findings on diagnostic imaging of breast: Secondary | ICD-10-CM | POA: Diagnosis not present

## 2021-12-17 DIAGNOSIS — R922 Inconclusive mammogram: Secondary | ICD-10-CM | POA: Diagnosis not present

## 2021-12-17 DIAGNOSIS — N6489 Other specified disorders of breast: Secondary | ICD-10-CM | POA: Insufficient documentation

## 2021-12-18 ENCOUNTER — Other Ambulatory Visit: Payer: Self-pay | Admitting: Family Medicine

## 2021-12-18 DIAGNOSIS — R928 Other abnormal and inconclusive findings on diagnostic imaging of breast: Secondary | ICD-10-CM

## 2021-12-18 DIAGNOSIS — N6489 Other specified disorders of breast: Secondary | ICD-10-CM

## 2021-12-23 ENCOUNTER — Other Ambulatory Visit: Payer: Self-pay | Admitting: Otolaryngology

## 2021-12-23 DIAGNOSIS — E041 Nontoxic single thyroid nodule: Secondary | ICD-10-CM

## 2021-12-23 DIAGNOSIS — D110 Benign neoplasm of parotid gland: Secondary | ICD-10-CM

## 2021-12-23 DIAGNOSIS — D37030 Neoplasm of uncertain behavior of the parotid salivary glands: Secondary | ICD-10-CM | POA: Diagnosis not present

## 2021-12-23 DIAGNOSIS — H6121 Impacted cerumen, right ear: Secondary | ICD-10-CM | POA: Diagnosis not present

## 2021-12-30 ENCOUNTER — Other Ambulatory Visit: Payer: Self-pay | Admitting: Family Medicine

## 2021-12-30 DIAGNOSIS — I10 Essential (primary) hypertension: Secondary | ICD-10-CM

## 2022-01-01 ENCOUNTER — Ambulatory Visit
Admission: RE | Admit: 2022-01-01 | Discharge: 2022-01-01 | Disposition: A | Discharge status: Home / Self Care | Payer: PPO | Source: Ambulatory Visit | Attending: Family Medicine | Admitting: Family Medicine

## 2022-01-01 DIAGNOSIS — R928 Other abnormal and inconclusive findings on diagnostic imaging of breast: Secondary | ICD-10-CM

## 2022-01-01 DIAGNOSIS — N6489 Other specified disorders of breast: Secondary | ICD-10-CM

## 2022-01-01 DIAGNOSIS — R921 Mammographic calcification found on diagnostic imaging of breast: Secondary | ICD-10-CM | POA: Diagnosis not present

## 2022-01-02 ENCOUNTER — Other Ambulatory Visit: Payer: Self-pay | Admitting: Family Medicine

## 2022-01-02 DIAGNOSIS — R928 Other abnormal and inconclusive findings on diagnostic imaging of breast: Secondary | ICD-10-CM

## 2022-01-02 LAB — SURGICAL PATHOLOGY

## 2022-01-15 ENCOUNTER — Ambulatory Visit
Admission: RE | Admit: 2022-01-15 | Discharge: 2022-01-15 | Disposition: A | Discharge status: Home / Self Care | Payer: PPO | Source: Ambulatory Visit | Attending: Otolaryngology | Admitting: Otolaryngology

## 2022-01-15 DIAGNOSIS — E041 Nontoxic single thyroid nodule: Secondary | ICD-10-CM | POA: Diagnosis not present

## 2022-01-16 ENCOUNTER — Other Ambulatory Visit: Payer: Self-pay | Admitting: Otolaryngology

## 2022-01-16 DIAGNOSIS — E041 Nontoxic single thyroid nodule: Secondary | ICD-10-CM

## 2022-01-21 ENCOUNTER — Other Ambulatory Visit: Payer: Self-pay | Admitting: Family Medicine

## 2022-01-21 DIAGNOSIS — L853 Xerosis cutis: Secondary | ICD-10-CM

## 2022-03-16 NOTE — Progress Notes (Unsigned)
Name: Madeline Pitts   MRN: [id number]    DOB: [birth year]   Date:03/17/2022       Progress Note  Subjective  Chief Complaint  Follow up   HPI  HTN: she has been taking medication as prescribed, and denies side effects. No chest pain, dizziness  or palpitation. BP has been at goal for the past two visits.    Leucopenia: stable over time, last level done in may was 3.4    Morbid obeisty:  she has HTN and hyperglycemia and dyslipidemia with a BMI above 35. She no longer drinks sodas, eating more vegetables and fruit , weight has been stable   Vitamin D deficiency: taking otc supplementation , currently her MVI has 2000 units of vitamin D on it    Pre-diabetes: last hgbA1C was again elevated at 6.2 %, reminded her of low carb diet She denies polyphagia, polydipsia or polyuria   Hyperlipidemia: she started Atorvastatin earlier this year and we will recheck level today, no side effects of medications   Iron deficiency anemia: it happened during COVID-19 Summer 2022 , she has been taking iron supplementation and last level improved, colonoscopy up to date , she did two rounds of hemoccults stools, both negative. She has resumed a high iron diet and also taking iron pills three times a week.   Enlarge thyroid and parathyroid: under the care of Dr. Pryor Ochoa, she denies dysphagia.   Abnormal mammogram left breast: showed asymmetry had a biopsy and is going back for a repeat left breast diagnostic and possible [place] for re-evaluation  Patient Active Problem List   Diagnosis Date Noted   Morbid obesity ([place]) 09/23/2021   Thyroid enlargement 09/23/2021   Enlarged parotid gland 09/23/2021   Iron deficiency 09/23/2021   Hyperglycemia 09/23/2021   Other neutropenia ([place]) 06/24/2021   Pre-diabetes 08/26/2016   Dyslipidemia 08/26/2016   Fibroid 04/25/2015   Essential hypertension 04/25/2015   Menopausal symptom 09/08/2012   Adult BMI 30+ 12/16/2010    Past Surgical History:  Procedure  Laterality Date   BREAST BIOPSY Right 09/26/2020   stereo biopsy/ coil clip/ benign   BREAST BIOPSY Right 09/26/2020   stereo biopsy/ coil clip/ benign   COLONOSCOPY  2009?   Dr Alveta Heimlich   COLONOSCOPY WITH PROPOFOL N/A 10/06/2017   Procedure: COLONOSCOPY WITH PROPOFOL;  Surgeon: Robert Bellow, MD;  Location: [facility] ENDOSCOPY;  Service: Endoscopy;  Laterality: N/A;   ENDOMETRIAL ABLATION      Family History  Problem Relation Age of Onset   Uterine cancer Mother    Gout Father    COPD Father    Emphysema Father        Was a heavy smoker   Lung cancer Brother        smoker   Lung cancer Brother    Breast cancer Neg Hx     Social History   Tobacco Use   Smoking status: Never   Smokeless tobacco: Never  Substance Use Topics   Alcohol use: No    Alcohol/week: 0.0 standard drinks of alcohol     Current Outpatient Medications:    ammonium lactate (LAC-HYDRIN) 12 % lotion, APPLY 1 APPLICATION TOPICALLY AS NEEDED FOR DRY SKIN, Disp: 400 mL, Rfl: 0   atorvastatin (LIPITOR) 20 MG tablet, Take 1 tablet (20 mg total) by mouth daily., Disp: 90 tablet, Rfl: 1   Ginseng 250 MG CAPS, Take by mouth., Disp: , Rfl:    GLUCOSAMINE SULFATE-MSM PO, Take 2 tablets by mouth once.,  Disp: , Rfl:    hydrocortisone 2.5 % cream, APPLY TOPICALLY TWICE A DAY, Disp: 28 g, Rfl: 0   Iron, Ferrous Sulfate, 325 (65 Fe) MG TABS, Take 325 mg by mouth daily., Disp: 30 tablet, Rfl: 5   losartan-hydrochlorothiazide (HYZAAR) 50-12.5 MG tablet, TAKE 1 TABLET BY MOUTH EVERY DAY, Disp: 90 tablet, Rfl: 0   Multiple Vitamin (MULTI-VITAMINS) TABS, Take by mouth., Disp: , Rfl:    Omega-3 Fatty Acids (FISH OIL) 1000 MG CAPS, Take 1 capsule by mouth 3 (three) times a week., Disp: , Rfl:    PREMPRO 0.3-1.5 MG tablet, Take 1 tablet by mouth daily., Disp: , Rfl:    TURMERIC PO, Take by mouth., Disp: , Rfl:    zinc gluconate 50 MG tablet, Take 50 mg by mouth 2 (two) times a week., Disp: , Rfl:   Allergies  Allergen  Reactions   Codeine Nausea Only and Other (See Comments)    Makes patient feel lethargic-can't move   Penicillins Nausea Only    I personally reviewed active problem list, medication list, allergies, family history, social history, health maintenance with the patient/caregiver today.   ROS  Constitutional: Negative for fever or weight change.  Respiratory: Negative for cough and shortness of breath.   Cardiovascular: Negative for chest pain or palpitations.  Gastrointestinal: Negative for abdominal pain, no bowel changes.  Musculoskeletal: Negative for gait problem or joint swelling.  Skin: Negative for rash.  Neurological: Negative for dizziness or headache.  No other specific complaints in a complete review of systems (except as listed in HPI above).   Objective  Vitals:   03/17/22 1106  BP: 122/76  Pulse: 97  Resp: 16  SpO2: 99%  Weight: 196 lb (88.9 kg)  Height: '5\' 2"'$  (1.575 m)    Body mass index is 35.85 kg/m.  Physical Exam  Constitutional: Patient appears well-developed and well-nourished. Obese  No distress.  HEENT: head atraumatic, normocephalic, pupils equal and reactive to light, neck supple, mild thyromegaly  Cardiovascular: Normal rate, regular rhythm and normal heart sounds.  No murmur heard. No BLE edema. Pulmonary/Chest: Effort normal and breath sounds normal. No respiratory distress. Abdominal: Soft.  There is no tenderness. Psychiatric: Patient has a normal mood and affect. behavior is normal. Judgment and thought content normal.   Recent Results (from the past 2160 hour(s))  Surgical pathology     Status: None   Collection Time: 01/01/22  8:45 AM  Result Value Ref Range   SURGICAL PATHOLOGY      SURGICAL PATHOLOGY CASE: [id number] PATIENT: [facility] Surgical Pathology Report     Specimen Submitted: A. Breast, right upper outer  Clinical History: Right breast asymmetry with CALCS, prev. benign BX in the region.  FCC, PASH,  SA, FA, malignancy.  2022 benign right stereo x 2 in adjacent tissue. X clip      DIAGNOSIS: A.  BREAST ASYMMETRY WITH CALCIFICATIONS, RIGHT UPPER OUTER; STEREOTACTIC BIOPSY: - NODULAR AREA WITH PSEUDOANGIOMATOUS STROMAL HYPERPLASIA, SMALL CYST FORMATION, AND COLUMNAR CELL CHANGE WITH ASSOCIATED CALCIFICATIONS. - NEGATIVE FOR ATYPIA AND MALIGNANCY.  GROSS DESCRIPTION: A. Labeled: Right breast stereo biopsy asymmetry with calcs upper outer Received: in a formalin-filled [place] collection device Specimen radiograph image(s) available for review Time/Date in fixative: Collected at 8:45 AM on 01/01/2022 and placed in formalin at 8:48 AM on 01/01/2022 Cold ischemic time: Less than 5 minutes Total fixation time: Approxim ately 8.25 hours Core pieces: Multiple Measurement: Aggregate, 4.7 x 0.9 x 0.3 cm Description / comments: Received  are cores and fragments of yellow fibrofatty tissue.  The accompanying diagram has sections B-F checked. Inked: Blue Entirely submitted in cassette(s):  1 - section B 2 - section C 3 - section D 4 - section E 5 - section F 6 - remaining fragments from section A, section G, and freely floating within the container  RB 01/01/2022  Final Diagnosis performed by Quay Burow, MD.   Electronically signed 01/02/2022 10:28:35AM The electronic signature indicates that the named Attending Pathologist has evaluated the specimen Technical component performed at [facility], [address], [place], [place] [address] Lab: [id number] Dir: Rush Farmer, MD, MMM  Professional component performed at [facility], [address] [place], [place], [place] [address] Lab: [phone] Dir: Kathi Simpers, MD     PHQ2/9:    03/17/2022   11:06 AM 12/09/2021   11:35 AM 09/23/2021   10:46 AM 06/24/2021   11:00 AM 06/12/2021   11:39 AM  Depression screen PHQ 2/9  Decreased Interest 0 0 0 0 0  Down, Depressed, Hopeless 0 0 0 0 0  PHQ - 2 Score 0 0 0 0  0  Altered sleeping 0 0  0   Tired, decreased energy 0 0  0   Change in appetite 0 0  0   Feeling bad or failure about yourself  0 0  0   Trouble concentrating 0 0  0   Moving slowly or fidgety/restless 0 0  0   Suicidal thoughts 0 0  0   PHQ-9 Score 0 0  0     phq 9 is negative   Fall Risk:    03/17/2022   11:06 AM 12/09/2021   11:35 AM 09/23/2021   10:38 AM 06/24/2021   11:00 AM 06/12/2021   11:40 AM  Fall Risk   Falls in the past year? 0 0 0 0 0  Number falls in past yr: 0 0  0 0  Injury with Fall? 0 0  0 0  Risk for fall due to : No Fall Risks No Fall Risks No Fall Risks No Fall Risks No Fall Risks  Follow up Falls prevention discussed Falls prevention discussed Falls prevention discussed Falls prevention discussed Falls prevention discussed      Functional Status Survey: Is the patient deaf or have difficulty hearing?: No Does the patient have difficulty seeing, even when wearing glasses/contacts?: No Does the patient have difficulty concentrating, remembering, or making decisions?: No Does the patient have difficulty walking or climbing stairs?: No Does the patient have difficulty dressing or bathing?: No Does the patient have difficulty doing errands alone such as visiting a doctor's office or shopping?: No    Assessment & Plan  1. Other neutropenia ([place])  We will recheck  CBC today   2. Essential hypertension  - losartan-hydrochlorothiazide (HYZAAR) 50-12.5 MG tablet; Take 1 tablet by mouth daily.  Dispense: 90 tablet; Refill: 1 - COMPLETE METABOLIC PANEL WITH GFR  3. Morbid obesity ([place])  Discussed with the patient the risk posed by an increased BMI. Discussed importance of portion control, calorie counting and at least 150 minutes of physical activity weekly. Avoid sweet beverages and drink more water. Eat at least 6 servings of fruit and vegetables daily    4. Dyslipidemia  - Lipid panel  5. Iron deficiency  - CBC with Differential/Platelet - Iron,  TIBC and Ferritin Panel  6. Need for immunization against influenza  - Flu Vaccine QUAD High Dose(Fluad)  7. Thyroid enlargement  She has a goiter and see Dr. Pryor Ochoa  8. Pre-diabetes   9. Vitamin D deficiency  - VITAMIN D 25 Hydroxy (Vit-D Deficiency, Fractures)  10. Hyperglycemia  - Hemoglobin A1c

## 2022-03-17 ENCOUNTER — Encounter: Payer: Self-pay | Admitting: Family Medicine

## 2022-03-17 ENCOUNTER — Other Ambulatory Visit: Payer: Self-pay | Admitting: Family Medicine

## 2022-03-17 ENCOUNTER — Ambulatory Visit (INDEPENDENT_AMBULATORY_CARE_PROVIDER_SITE_OTHER): Payer: PPO | Admitting: Family Medicine

## 2022-03-17 VITALS — BP 122/76 | HR 97 | Resp 16 | Ht 62.0 in | Wt 196.0 lb

## 2022-03-17 DIAGNOSIS — E785 Hyperlipidemia, unspecified: Secondary | ICD-10-CM | POA: Diagnosis not present

## 2022-03-17 DIAGNOSIS — R7303 Prediabetes: Secondary | ICD-10-CM | POA: Diagnosis not present

## 2022-03-17 DIAGNOSIS — D708 Other neutropenia: Secondary | ICD-10-CM | POA: Diagnosis not present

## 2022-03-17 DIAGNOSIS — I10 Essential (primary) hypertension: Secondary | ICD-10-CM | POA: Diagnosis not present

## 2022-03-17 DIAGNOSIS — R739 Hyperglycemia, unspecified: Secondary | ICD-10-CM

## 2022-03-17 DIAGNOSIS — E611 Iron deficiency: Secondary | ICD-10-CM | POA: Diagnosis not present

## 2022-03-17 DIAGNOSIS — E049 Nontoxic goiter, unspecified: Secondary | ICD-10-CM

## 2022-03-17 DIAGNOSIS — E559 Vitamin D deficiency, unspecified: Secondary | ICD-10-CM | POA: Diagnosis not present

## 2022-03-17 DIAGNOSIS — Z23 Encounter for immunization: Secondary | ICD-10-CM | POA: Diagnosis not present

## 2022-03-17 MED ORDER — LOSARTAN POTASSIUM-HCTZ 50-12.5 MG PO TABS: 1.0000 | ORAL_TABLET | Freq: Every day | ORAL | 1 refills | Status: DC

## 2022-03-17 NOTE — Patient Instructions (Signed)
You need shingrix, COVID -19  and Tdap booster

## 2022-03-18 LAB — CBC WITH DIFFERENTIAL/PLATELET
Absolute Monocytes: 217 cells/uL (ref 200–950)
Basophils Absolute: 21 cells/uL (ref 0–200)
Basophils Relative: 0.6 %
Eosinophils Absolute: 60 cells/uL (ref 15–500)
Eosinophils Relative: 1.7 %
HCT: 37.7 % (ref 35.0–45.0)
Hemoglobin: 12.4 g/dL (ref 11.7–15.5)
Lymphs Abs: 991 cells/uL (ref 850–3900)
MCH: 27.9 pg (ref 27.0–33.0)
MCHC: 32.9 g/dL (ref 32.0–36.0)
MCV: 84.9 fL (ref 80.0–100.0)
MPV: 9.8 fL (ref 7.5–12.5)
Monocytes Relative: 6.2 %
Neutro Abs: 2212 cells/uL (ref 1500–7800)
Neutrophils Relative %: 63.2 %
Platelets: 281 10*3/uL (ref 140–400)
RBC: 4.44 10*6/uL (ref 3.80–5.10)
RDW: 14 % (ref 11.0–15.0)
Total Lymphocyte: 28.3 %
WBC: 3.5 10*3/uL — ABNORMAL LOW (ref 3.8–10.8)

## 2022-03-18 LAB — IRON,TIBC AND FERRITIN PANEL
%SAT: 23 % (calc) (ref 16–45)
Ferritin: 23 ng/mL (ref 16–288)
Iron: 69 ug/dL (ref 45–160)
TIBC: 302 mcg/dL (calc) (ref 250–450)

## 2022-03-18 LAB — COMPLETE METABOLIC PANEL WITH GFR
AG Ratio: 1.8 (calc) (ref 1.0–2.5)
ALT: 21 U/L (ref 6–29)
AST: 18 U/L (ref 10–35)
Albumin: 4.2 g/dL (ref 3.6–5.1)
Alkaline phosphatase (APISO): 82 U/L (ref 37–153)
BUN: 14 mg/dL (ref 7–25)
CO2: 31 mmol/L (ref 20–32)
Calcium: 9.6 mg/dL (ref 8.6–10.4)
Chloride: 101 mmol/L (ref 98–110)
Creat: 0.86 mg/dL (ref 0.50–1.05)
Globulin: 2.3 g/dL (calc) (ref 1.9–3.7)
Glucose, Bld: 115 mg/dL — ABNORMAL HIGH (ref 65–99)
Potassium: 4 mmol/L (ref 3.5–5.3)
Sodium: 141 mmol/L (ref 135–146)
Total Bilirubin: 0.4 mg/dL (ref 0.2–1.2)
Total Protein: 6.5 g/dL (ref 6.1–8.1)
eGFR: 73 mL/min/{1.73_m2} (ref >=60)

## 2022-03-18 LAB — HEMOGLOBIN A1C
Hgb A1c MFr Bld: 6.6 % of total Hgb — ABNORMAL HIGH (ref <5.7)
Mean Plasma Glucose: 143 mg/dL
eAG (mmol/L): 7.9 mmol/L

## 2022-03-18 LAB — LIPID PANEL
Cholesterol: 137 mg/dL (ref <200)
HDL: 49 mg/dL — ABNORMAL LOW (ref >=50)
LDL Cholesterol (Calc): 65 mg/dL (calc)
Non-HDL Cholesterol (Calc): 88 mg/dL (calc) (ref <130)
Total CHOL/HDL Ratio: 2.8 (calc) (ref <5.0)
Triglycerides: 142 mg/dL (ref <150)

## 2022-03-18 LAB — VITAMIN D 25 HYDROXY (VIT D DEFICIENCY, FRACTURES): Vit D, 25-Hydroxy: 43 ng/mL (ref 30–100)

## 2022-03-19 ENCOUNTER — Other Ambulatory Visit: Payer: Self-pay | Admitting: Family Medicine

## 2022-03-19 DIAGNOSIS — E785 Hyperlipidemia, unspecified: Secondary | ICD-10-CM

## 2022-03-19 MED ORDER — ATORVASTATIN CALCIUM 20 MG PO TABS: 20.0000 mg | ORAL_TABLET | Freq: Every day | ORAL | 1 refills | Status: DC

## 2022-03-19 NOTE — Telephone Encounter (Signed)
Copied from [place] [id number]. Topic: General - Other >> Mar 19, 2022 11:55 AM Everette C wrote: Reason for CRM: Medication Refill - Medication: atorvastatin (LIPITOR) 20 MG tablet [[id number]]  Has the patient contacted their pharmacy? Yes.  The patient has been directed to contact their PCP and will be traveling out of town  (Agent: If no, request that the patient contact the pharmacy for the refill. If patient does not wish to contact the pharmacy document the reason why and proceed with request.) (Agent: If yes, when and what did the pharmacy advise?)  Preferred Pharmacy (with phone number or street name): [id number]: [phone]: [phone]: Not open 24 hours  Has the patient been seen for an appointment in the last year OR does the patient have an upcoming appointment? Yes.    Agent: Please be advised that RX refills may take up to 3 business days. We ask that you follow-up with your pharmacy.

## 2022-03-19 NOTE — Telephone Encounter (Signed)
Rx has already been sent to pharmacy from provider for #90/1.  Requested Prescriptions  Pending Prescriptions Disp Refills   atorvastatin (LIPITOR) 20 MG tablet 90 tablet 1    Sig: Take 1 tablet (20 mg total) by mouth daily.     Cardiovascular:  Antilipid - Statins Failed - 03/19/2022 12:05 PM      Failed - Lipid Panel in normal range within the last 12 months    Cholesterol, Total  Date Value Ref Range Status  04/25/2015 203 (H) 100 - 199 mg/dL Final   Cholesterol  Date Value Ref Range Status  03/17/2022 137 <200 mg/dL Final   LDL Cholesterol (Calc)  Date Value Ref Range Status  03/17/2022 65 mg/dL (calc) Final    Comment:    Reference range: <100 . Desirable range <100 mg/dL for primary prevention;   <70 mg/dL for patients with CHD or diabetic patients  with > or = 2 CHD risk factors. Marland Kitchen LDL-C is now calculated using the Martin-Hopkins  calculation, which is a validated novel method providing  better accuracy than the Friedewald equation in the  estimation of LDL-C.  Cresenciano Genre et al. Annamaria Helling. 6759;163(84): 2061-2068  (http://education.[employer].com/faq/FAQ164)    HDL  Date Value Ref Range Status  03/17/2022 49 (L) > OR = 50 mg/dL Final  04/25/2015 50 >39 mg/dL Final   Triglycerides  Date Value Ref Range Status  03/17/2022 142 <150 mg/dL Final         Passed - Patient is not pregnant      Passed - Valid encounter within last 12 months    Recent Outpatient Visits           2 days ago Other neutropenia [facility])   [facility] Steele Sizer, MD   3 months ago Well adult exam   [facility] Steele Sizer, MD   5 months ago Morbid obesity [facility])   [facility] Steele Sizer, MD   8 months ago Morbid obesity [facility]   [facility] Steele Sizer, MD   1 year ago Enlarged parotid gland   [facility] Steele Sizer, MD       Future Appointments              In 2 months  [facility] [place]   In 9 months Steele Sizer, MD [facility]

## 2022-06-16 ENCOUNTER — Ambulatory Visit: Payer: PPO

## 2022-06-19 ENCOUNTER — Ambulatory Visit (INDEPENDENT_AMBULATORY_CARE_PROVIDER_SITE_OTHER): Payer: PPO

## 2022-06-19 VITALS — BP 126/68 | Ht 62.0 in | Wt 196.7 lb

## 2022-06-19 DIAGNOSIS — Z0000 Encounter for general adult medical examination without abnormal findings: Secondary | ICD-10-CM | POA: Diagnosis not present

## 2022-06-19 NOTE — Patient Instructions (Signed)
[name] , Thank you for taking time to come for your Medicare Wellness Visit. I appreciate your ongoing commitment to your health goals. Please review the following plan we discussed and let me know if I can assist you in the future.   These are the goals we discussed:  Goals      Weight (lb) < 175 lb (79.4 kg)     Pt would like to lose weight over the next year with diet and exercise.         This is a list of the screening recommended for you and due dates:  Health Maintenance  Topic Date Due   Zoster (Shingles) Vaccine (1 of 2) Never done   COVID-19 Vaccine (6 - 2023-24 season) 01/09/2022   DTaP/Tdap/Td vaccine (2 - Td or Tdap) 03/14/2022   Colon Cancer Screening  10/07/2022   Mammogram  12/18/2022   Medicare Annual Wellness Visit  06/20/2023   Pneumonia Vaccine  Completed   Flu Shot  Completed   DEXA scan (bone density measurement)  Completed   Hepatitis C Screening: USPSTF Recommendation to screen - Ages 18-79 yo.  Completed   HPV Vaccine  Aged Out    Advanced directives: no  Conditions/risks identified: none  Next appointment: Follow up in one year for your annual wellness visit 07/01/2023 @11am$  in person   Preventive Care 65 Years and Older, Female Preventive care refers to lifestyle choices and visits with your health care provider that can promote health and wellness. What does preventive care include? A yearly physical exam. This is also called an annual well check. Dental exams once or twice a year. Routine eye exams. Ask your health care provider how often you should have your eyes checked. Personal lifestyle choices, including: Daily care of your teeth and gums. Regular physical activity. Eating a healthy diet. Avoiding tobacco and drug use. Limiting alcohol use. Practicing safe sex. Taking low-dose aspirin every day. Taking vitamin and mineral supplements as recommended by your health care provider. What happens during an annual well check? The  services and screenings done by your health care provider during your annual well check will depend on your age, overall health, lifestyle risk factors, and family history of disease. Counseling  Your health care provider may ask you questions about your: Alcohol use. Tobacco use. Drug use. Emotional well-being. Home and relationship well-being. Sexual activity. Eating habits. History of falls. Memory and ability to understand (cognition). Work and work Statistician. Reproductive health. Screening  You may have the following tests or measurements: Height, weight, and BMI. Blood pressure. Lipid and cholesterol levels. These may be checked every 5 years, or more frequently if you are over 2 years old. Skin check. Lung cancer screening. You may have this screening every year starting at age 7 if you have a 30-pack-year history of smoking and currently smoke or have quit within the past 15 years. Fecal occult blood test (FOBT) of the stool. You may have this test every year starting at age 66. Flexible sigmoidoscopy or colonoscopy. You may have a sigmoidoscopy every 5 years or a colonoscopy every 10 years starting at age 89. Hepatitis C blood test. Hepatitis B blood test. Sexually transmitted disease (STD) testing. Diabetes screening. This is done by checking your blood sugar (glucose) after you have not eaten for a while (fasting). You may have this done every 1-3 years. Bone density scan. This is done to screen for osteoporosis. You may have this done starting at age 67. Mammogram. This  may be done every 1-2 years. Talk to your health care provider about how often you should have regular mammograms. Talk with your health care provider about your test results, treatment options, and if necessary, the need for more tests. Vaccines  Your health care provider may recommend certain vaccines, such as: Influenza vaccine. This is recommended every year. Tetanus, diphtheria, and acellular  pertussis (Tdap, Td) vaccine. You may need a Td booster every 10 years. Zoster vaccine. You may need this after age 3. Pneumococcal 13-valent conjugate (PCV13) vaccine. One dose is recommended after age 61. Pneumococcal polysaccharide (PPSV23) vaccine. One dose is recommended after age 41. Talk to your health care provider about which screenings and vaccines you need and how often you need them. This information is not intended to replace advice given to you by your health care provider. Make sure you discuss any questions you have with your health care provider. Document Released: 05/24/2015 Document Revised: 01/15/2016 Document Reviewed: 02/26/2015 Elsevier Interactive Patient Education  2017 [place] Prevention in the Home Falls can cause injuries. They can happen to people of all ages. There are many things you can do to make your home safe and to help prevent falls. What can I do on the outside of my home? Regularly fix the edges of walkways and driveways and fix any cracks. Remove anything that might make you trip as you walk through a door, such as a raised step or threshold. Trim any bushes or trees on the path to your home. Use bright outdoor lighting. Clear any walking paths of anything that might make someone trip, such as rocks or tools. Regularly check to see if handrails are loose or broken. Make sure that both sides of any steps have handrails. Any raised decks and porches should have guardrails on the edges. Have any leaves, snow, or ice cleared regularly. Use sand or salt on walking paths during winter. Clean up any spills in your garage right away. This includes oil or grease spills. What can I do in the bathroom? Use night lights. Install grab bars by the toilet and in the tub and shower. Do not use towel bars as grab bars. Use non-skid mats or decals in the tub or shower. If you need to sit down in the shower, use a plastic, non-slip stool. Keep the floor  dry. Clean up any water that spills on the floor as soon as it happens. Remove soap buildup in the tub or shower regularly. Attach bath mats securely with double-sided non-slip rug tape. Do not have throw rugs and other things on the floor that can make you trip. What can I do in the bedroom? Use night lights. Make sure that you have a light by your bed that is easy to reach. Do not use any sheets or blankets that are too big for your bed. They should not hang down onto the floor. Have a firm chair that has side arms. You can use this for support while you get dressed. Do not have throw rugs and other things on the floor that can make you trip. What can I do in the kitchen? Clean up any spills right away. Avoid walking on wet floors. Keep items that you use a lot in easy-to-reach places. If you need to reach something above you, use a strong step stool that has a grab bar. Keep electrical cords out of the way. Do not use floor polish or wax that makes floors slippery. If you must  use wax, use non-skid floor wax. Do not have throw rugs and other things on the floor that can make you trip. What can I do with my stairs? Do not leave any items on the stairs. Make sure that there are handrails on both sides of the stairs and use them. Fix handrails that are broken or loose. Make sure that handrails are as long as the stairways. Check any carpeting to make sure that it is firmly attached to the stairs. Fix any carpet that is loose or worn. Avoid having throw rugs at the top or bottom of the stairs. If you do have throw rugs, attach them to the floor with carpet tape. Make sure that you have a light switch at the top of the stairs and the bottom of the stairs. If you do not have them, ask someone to add them for you. What else can I do to help prevent falls? Wear shoes that: Do not have high heels. Have rubber bottoms. Are comfortable and fit you well. Are closed at the toe. Do not wear  sandals. If you use a stepladder: Make sure that it is fully opened. Do not climb a closed stepladder. Make sure that both sides of the stepladder are locked into place. Ask someone to hold it for you, if possible. Clearly mark and make sure that you can see: Any grab bars or handrails. First and last steps. Where the edge of each step is. Use tools that help you move around (mobility aids) if they are needed. These include: Canes. Walkers. Scooters. Crutches. Turn on the lights when you go into a dark area. Replace any light bulbs as soon as they burn out. Set up your furniture so you have a clear path. Avoid moving your furniture around. If any of your floors are uneven, fix them. If there are any pets around you, be aware of where they are. Review your medicines with your doctor. Some medicines can make you feel dizzy. This can increase your chance of falling. Ask your doctor what other things that you can do to help prevent falls. This information is not intended to replace advice given to you by your health care provider. Make sure you discuss any questions you have with your health care provider. Document Released: 02/21/2009 Document Revised: 10/03/2015 Document Reviewed: 06/01/2014 Elsevier Interactive Patient Education  2017 [employer].

## 2022-06-19 NOTE — Progress Notes (Signed)
Subjective:   Madeline Pitts is a 72 y.o. female who presents for Medicare Annual (Subsequent) preventive examination.  Review of Systems    Cardiac Risk Factors include: dyslipidemia;hypertension;advanced age (>60mn, >>19women);obesity (BMI >30kg/m2)    Objective:    Today's Vitals   06/19/22 1131  BP: 126/68  Weight: 196 lb 11.2 oz (89.2 kg)  Height: 5' 2"$  (1.575 m)   Body mass index is 35.98 kg/m.     06/19/2022   12:09 PM 06/12/2021   11:39 AM 05/02/2020   10:27 AM 03/28/2019   11:54 AM 10/06/2017   10:04 AM 08/26/2016   11:30 AM 05/26/2016   11:49 AM  Advanced Directives  Does Patient Have a Medical Advance Directive? No No No No No No No  Would patient like information on creating a medical advance directive? No - Patient declined No - Patient declined No - Patient declined Yes (MAU/Ambulatory/Procedural Areas - Information given) No - Patient declined      Current Medications (verified) Outpatient Encounter Medications as of 06/19/2022  Medication Sig   ammonium lactate (LAC-HYDRIN) 12 % lotion APPLY 1 APPLICATION TOPICALLY AS NEEDED FOR DRY SKIN   atorvastatin (LIPITOR) 20 MG tablet Take 1 tablet (20 mg total) by mouth daily.   Ginseng 250 MG CAPS Take by mouth.   GLUCOSAMINE SULFATE-MSM PO Take 2 tablets by mouth once.   hydrocortisone 2.5 % cream APPLY TOPICALLY TWICE A DAY   Iron, Ferrous Sulfate, 325 (65 Fe) MG TABS Take 325 mg by mouth daily.   losartan-hydrochlorothiazide (HYZAAR) 50-12.5 MG tablet Take 1 tablet by mouth daily.   Multiple Vitamin (MULTI-VITAMINS) TABS Take by mouth.   Omega-3 Fatty Acids (FISH OIL) 1000 MG CAPS Take 1 capsule by mouth 3 (three) times a week.   PREMPRO 0.3-1.5 MG tablet Take 1 tablet by mouth daily.   TURMERIC PO Take by mouth.   zinc gluconate 50 MG tablet Take 50 mg by mouth 2 (two) times a week.   No facility-administered encounter medications on file as of 06/19/2022.    Allergies (verified) Codeine and Penicillins    History: Past Medical History:  Diagnosis Date   Hypertension    Obesity    Prediabetes    Vitamin D deficiency    Past Surgical History:  Procedure Laterality Date   BREAST BIOPSY Right 09/26/2020   stereo biopsy/ coil clip/ benign   BREAST BIOPSY Right 09/26/2020   stereo biopsy/ coil clip/ benign   COLONOSCOPY  2009?   Dr WAlveta Heimlich  COLONOSCOPY WITH PROPOFOL N/A 10/06/2017   Procedure: COLONOSCOPY WITH PROPOFOL;  Surgeon: BRobert Bellow MD;  [facility];  Service: Endoscopy;  Laterality: N/A;   ENDOMETRIAL ABLATION     Family History  Problem Relation Age of Onset   Uterine cancer Mother    Gout Father    COPD Father    Emphysema Father        Was a heavy smoker   Lung cancer Brother        smoker   Lung cancer Brother    Breast cancer Neg Hx    Social History   Socioeconomic History   Marital status: Married    Spouse name: SKarle Starch   Number of children: 2   Years of education: Not on file   Highest education level: Bachelor's degree (e.g., BA, AB, BS)  Occupational History   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   Tobacco Use   Smoking status: Never  Smokeless tobacco: Never  Vaping Use   Vaping Use: Never used  Substance and Sexual Activity   Alcohol use: No    Alcohol/week: 0.0 standard drinks of alcohol   Drug use: No   Sexual activity: Yes    Partners: Male    Birth control/protection: Post-menopausal  Other Topics Concern   Not on file  Social History Narrative   Not on file   Social Determinants of Health   Financial Resource Strain: Low Risk  (06/19/2022)   Overall Financial Resource Strain (CARDIA)    Difficulty of Paying Living Expenses: Not hard at all  Food Insecurity: No Food Insecurity (06/19/2022)   Hunger Vital Sign    Worried About Running Out of Food in the Last Year: Never true    Ran Out of Food in the Last Year: Never true  Transportation Needs: No Transportation Needs (06/19/2022)   PRAPARE -  Hydrologist (Medical): No    Lack of Transportation (Non-Medical): No  Physical Activity: Sufficiently Active (06/19/2022)   Exercise Vital Sign    Days of Exercise per Week: 4 days    Minutes of Exercise per Session: 50 min  Stress: No Stress Concern Present (06/19/2022)   Calcium    Feeling of Stress : Not at all  Social Connections: [place] (06/19/2022)   Social Connection and Isolation Panel [NHANES]    Frequency of Communication with Friends and Family: More than three times a week    Frequency of Social Gatherings with Friends and Family: More than three times a week    Attends Religious Services: More than 4 times per year    Active Member of [employer] or Organizations: Yes    Attends Music therapist: More than 4 times per year    Marital Status: Married    Tobacco Counseling Counseling given: Not Answered   Clinical Intake:  Pre-visit preparation completed: Yes  Pain : No/denies pain     BMI - recorded: 35.98 Nutritional Status: BMI > 30  Obese Nutritional Risks: None Diabetes: No  How often do you need to have someone help you when you read instructions, pamphlets, or other written materials from your doctor or pharmacy?: 1 - Never  Diabetic?NO  Interpreter Needed?: No  Information entered by :: B.Jahmai Finelli,LPN   Activities of Daily Living    06/19/2022   11:59 AM 03/17/2022   11:06 AM  In your present state of health, do you have any difficulty performing the following activities:  Hearing? 0 0  Vision? 0 0  Difficulty concentrating or making decisions? 0 0  Walking or climbing stairs? 0 0  Dressing or bathing? 0 0  Doing errands, shopping? 0 0  Preparing Food and eating ? N   Using the Toilet? N   In the past six months, have you accidently leaked urine? N   Do you have problems with loss of bowel control? N   Managing your Medications?  N   Managing your Finances? N   Housekeeping or managing your Housekeeping? N     Patient Care Team: Steele Sizer, MD as PCP - General (Family Medicine) Wyatt Haste, MD as Referring Physician (Obstetrics and Gynecology)  Indicate any recent Medical Services you may have received from other than [facility] providers in the past year (date may be approximate).     Assessment:   This is a routine wellness examination for Gracie.  Hearing/Vision screen Hearing  Screening - Comments:: Adequate hearing Vision Screening - Comments:: Wears readers;[facility]/  Dietary issues and exercise activities discussed: Current Exercise Habits: Home exercise routine, Type of exercise: walking;stretching, Time (Minutes): 40, Frequency (Times/Week): 3, Weekly Exercise (Minutes/Week): 120, Intensity: Mild, Exercise limited by: None identified   Goals Addressed   None    Depression Screen    06/19/2022   11:46 AM 03/17/2022   11:06 AM 12/09/2021   11:35 AM 09/23/2021   10:46 AM 06/24/2021   11:00 AM 06/12/2021   11:39 AM 03/06/2021   10:20 AM  PHQ 2/9 Scores  PHQ - 2 Score 0 0 0 0 0 0 0  PHQ- 9 Score  0 0  0  0    Fall Risk    06/19/2022   11:39 AM 03/17/2022   11:06 AM 12/09/2021   11:35 AM 09/23/2021   10:38 AM 06/24/2021   11:00 AM  Fall Risk   Falls in the past year? 0 0 0 0 0  Number falls in past yr: 0 0 0  0  Injury with Fall? 0 0 0  0  Risk for fall due to : No Fall Risks No Fall Risks No Fall Risks No Fall Risks No Fall Risks  Follow up Education provided;Falls prevention discussed Falls prevention discussed Falls prevention discussed Falls prevention discussed Falls prevention discussed    FALL RISK PREVENTION PERTAINING TO THE HOME:  Any stairs in or around the home? Yes  If so, are there any without handrails? Yes  Home free of loose throw rugs in walkways, pet beds, electrical cords, etc? Yes  Adequate lighting in your home to reduce risk of falls? Yes   ASSISTIVE DEVICES  UTILIZED TO PREVENT FALLS:  Life alert? No  Use of a cane, walker or w/c? No  Grab bars in the bathroom? No  Shower chair or bench in shower? No  Elevated toilet seat or a handicapped toilet? No   TIMED UP AND GO:  Was the test performed? Yes .  Length of time to ambulate 10 feet: 10 sec.   Gait steady and fast without use of assistive device  Cognitive Function:        06/19/2022   11:58 AM 05/02/2020   10:30 AM  6CIT Screen  What Year? 0 points 0 points  What month? 0 points 0 points  What time? 0 points 0 points  Count back from 20 0 points 0 points  Months in reverse 0 points 0 points  Repeat phrase 0 points 0 points  Total Score 0 points 0 points    Immunizations Immunization History  Administered Date(s) Administered   Fluad Quad(high Dose 65+) 03/28/2019, 05/02/2020, 03/17/2022   Influenza, High Dose Seasonal PF 01/17/2021   Moderna Covid-19 Vaccine Bivalent Booster 62yr & up 04/01/2021   Moderna Sars-Covid-2 Vaccination 06/09/2019, 07/07/2019, 03/15/2020, 12/21/2020   PNEUMOCOCCAL CONJUGATE-20 12/03/2020   Pneumococcal Conjugate-13 12/19/2018   Tdap 03/14/2012    TDAP status: Up to date  Flu Vaccine status: Up to date  Pneumococcal vaccine status: Up to date  Covid-19 vaccine status: Completed vaccines  Qualifies for Shingles Vaccine? Yes   Zostavax completed No   Shingrix Completed?: No.    Education has been provided regarding the importance of this vaccine. Patient has been advised to call insurance company to determine out of pocket expense if they have not yet received this vaccine. Advised may also receive vaccine at local pharmacy or Health Dept. Verbalized acceptance and understanding. Will schedule  at pharmacy  Screening Tests Health Maintenance  Topic Date Due   Zoster Vaccines- Shingrix (1 of 2) Never done   COVID-19 Vaccine (6 - 2023-24 season) 01/09/2022   DTaP/Tdap/Td (2 - Td or Tdap) 03/14/2022   COLONOSCOPY (Pts 45-80yr Insurance  coverage will need to be confirmed)  10/07/2022   MAMMOGRAM  12/18/2022   Medicare Annual Wellness (AWV)  06/20/2023   Pneumonia Vaccine 70 Years old  Completed   INFLUENZA VACCINE  Completed   DEXA SCAN  Completed   Hepatitis C Screening  Completed   HPV VACCINES  Aged Out    Health Maintenance  Health Maintenance Due  Topic Date Due   Zoster Vaccines- Shingrix (1 of 2) Never done   COVID-19 Vaccine (6 - 2023-24 season) 01/09/2022   DTaP/Tdap/Td (2 - Td or Tdap) 03/14/2022    Colorectal cancer screening: Referral to GI placed yes. Pt aware the office will call re: appt.  Mammogram status: Ordered yes. Pt provided with contact info and advised to call to schedule appt.   Bone Density status: Completed yes. Results reflect: Bone density results: NORMAL. Repeat every 5 years.  Lung Cancer Screening: (Low Dose CT Chest recommended if Age 70-80years, 30 pack-year currently smoking OR have quit w/in 15years.) does not qualify.   Lung Cancer Screening Referral: no  Additional Screening:  Hepatitis C Screening: does not qualify; Completed no  Vision Screening: Recommended annual ophthalmology exams for early detection of glaucoma and other disorders of the eye. Is the patient up to date with their annual eye exam?  Yes  Who is the provider or what is the name of the office in which the patient attends annual eye exams? [facility] If pt is not established with a provider, would they like to be referred to a provider to establish care? No .   Dental Screening: Recommended annual dental exams for proper oral hygiene. needs crown on one tooth:waiting for appt  Community Resource Referral / Chronic Care Management: CRR required this visit?  No   CCM required this visit?  No      Plan:     I have personally reviewed and noted the following in the patient's chart:   Medical and social history Use of alcohol, tobacco or illicit drugs  Current medications and supplements  including opioid prescriptions. Patient is not currently taking opioid prescriptions. Functional ability and status Nutritional status Physical activity Advanced directives List of other physicians Hospitalizations, surgeries, and ER visits in previous 12 months Vitals Screenings to include cognitive, depression, and falls Referrals and appointments  In addition, I have reviewed and discussed with patient certain preventive protocols, quality metrics, and best practice recommendations. A written personalized care plan for preventive services as well as general preventive health recommendations were provided to patient.     [facility] LPN   [id number]  Nurse Notes: pt states she is doing fine. She is watching what she is eating as she desires to lose weight and prevent diabetes. No questions or concerns at this time.

## 2022-07-08 ENCOUNTER — Other Ambulatory Visit: Payer: PPO

## 2022-07-10 ENCOUNTER — Ambulatory Visit
Admission: RE | Admit: 2022-07-10 | Discharge: 2022-07-10 | Disposition: A | Discharge status: Home / Self Care | Payer: PPO | Source: Ambulatory Visit | Attending: Otolaryngology | Admitting: Otolaryngology

## 2022-07-10 DIAGNOSIS — E042 Nontoxic multinodular goiter: Secondary | ICD-10-CM | POA: Diagnosis not present

## 2022-07-10 DIAGNOSIS — D110 Benign neoplasm of parotid gland: Secondary | ICD-10-CM | POA: Diagnosis not present

## 2022-07-10 DIAGNOSIS — J341 Cyst and mucocele of nose and nasal sinus: Secondary | ICD-10-CM | POA: Diagnosis not present

## 2022-07-10 MED ORDER — IOPAMIDOL (ISOVUE-300) INJECTION 61%
75.0000 mL | Freq: Once | INTRAVENOUS | Status: AC | PRN
  Administered 2022-07-10: 75 mL via INTRAVENOUS

## 2022-07-14 ENCOUNTER — Ambulatory Visit
Admission: RE | Admit: 2022-07-14 | Discharge: 2022-07-14 | Disposition: A | Discharge status: Home / Self Care | Payer: PPO | Source: Ambulatory Visit | Attending: Family Medicine | Admitting: Family Medicine

## 2022-07-14 ENCOUNTER — Other Ambulatory Visit: Payer: Self-pay

## 2022-07-14 DIAGNOSIS — R928 Other abnormal and inconclusive findings on diagnostic imaging of breast: Secondary | ICD-10-CM

## 2022-07-14 NOTE — Progress Notes (Signed)
Order placed

## 2022-08-18 ENCOUNTER — Telehealth: Payer: Self-pay

## 2022-08-18 NOTE — Telephone Encounter (Signed)
The patient is due for their Colonoscopy. Their last colonoscopy was done 10/06/2017 by Dr Lemar Livings. Dr Lemar Livings is no longer at our office and non of our providers do this service. We would like to know if you would like for [place] to send a referral to  Gastroenterology of choice and have them contact you to get this scheduled. Patient states that she will speak with her PCP, Dr Carlynn Purl, about getting a referral.

## 2022-09-07 DIAGNOSIS — Z01419 Encounter for gynecological examination (general) (routine) without abnormal findings: Secondary | ICD-10-CM | POA: Diagnosis not present

## 2022-09-07 DIAGNOSIS — Z124 Encounter for screening for malignant neoplasm of cervix: Secondary | ICD-10-CM | POA: Diagnosis not present

## 2022-09-07 DIAGNOSIS — R87610 Atypical squamous cells of undetermined significance on cytologic smear of cervix (ASC-US): Secondary | ICD-10-CM | POA: Diagnosis not present

## 2022-09-07 DIAGNOSIS — N898 Other specified noninflammatory disorders of vagina: Secondary | ICD-10-CM | POA: Diagnosis not present

## 2022-09-07 DIAGNOSIS — Z79890 Hormone replacement therapy (postmenopausal): Secondary | ICD-10-CM | POA: Diagnosis not present

## 2022-09-12 ENCOUNTER — Other Ambulatory Visit: Payer: Self-pay | Admitting: Family Medicine

## 2022-09-12 DIAGNOSIS — E785 Hyperlipidemia, unspecified: Secondary | ICD-10-CM

## 2022-09-15 ENCOUNTER — Ambulatory Visit: Payer: PPO | Admitting: Family Medicine

## 2022-09-26 ENCOUNTER — Other Ambulatory Visit: Payer: Self-pay | Admitting: Family Medicine

## 2022-09-26 DIAGNOSIS — I10 Essential (primary) hypertension: Secondary | ICD-10-CM

## 2022-09-28 ENCOUNTER — Other Ambulatory Visit: Payer: Self-pay

## 2022-09-28 DIAGNOSIS — I10 Essential (primary) hypertension: Secondary | ICD-10-CM

## 2022-09-28 MED ORDER — LOSARTAN POTASSIUM-HCTZ 50-12.5 MG PO TABS: 1.0000 | ORAL_TABLET | Freq: Every day | ORAL | 1 refills | Status: DC

## 2022-11-09 ENCOUNTER — Other Ambulatory Visit: Payer: Self-pay | Admitting: Family Medicine

## 2022-11-09 DIAGNOSIS — L853 Xerosis cutis: Secondary | ICD-10-CM

## 2022-11-10 ENCOUNTER — Other Ambulatory Visit: Payer: Self-pay

## 2022-11-10 ENCOUNTER — Other Ambulatory Visit: Payer: Self-pay | Admitting: Family Medicine

## 2022-11-10 DIAGNOSIS — L853 Xerosis cutis: Secondary | ICD-10-CM

## 2022-11-10 NOTE — Telephone Encounter (Signed)
Medication Refill - Medication: ammonium lactate (LAC-HYDRIN) 12 % lotion                               (for this medication she would like it to [facility]  Has the patient contacted their pharmacy? Yes.    [facility] [id number] - [place], [place] - [address]  [address] [place] [address]  Phone: [phone] Fax: [phone]  Hours: Not open 24 hours     (Has the patient been seen for an appointment in the last year OR does the patient have an upcoming appointment? Yes.    Agent: Please be advised that RX refills may take up to 3 business days. We ask that you follow-up with your pharmacy.

## 2022-11-11 ENCOUNTER — Other Ambulatory Visit: Payer: Self-pay

## 2022-11-11 DIAGNOSIS — L853 Xerosis cutis: Secondary | ICD-10-CM

## 2022-11-11 NOTE — Telephone Encounter (Signed)
Refilled 11/11/22 Requested Prescriptions  Refused Prescriptions Disp Refills   ammonium lactate (LAC-HYDRIN) 12 % lotion 400 mL 0    Sig: Apply topically as needed for dry skin.     Dermatology:  Other Passed - 11/10/2022  1:58 PM      Passed - Valid encounter within last 12 months    Recent Outpatient Visits           7 months ago Other neutropenia [facility])   [place] [facility] Alba Cory, MD   11 months ago Well adult exam   [facility] Alba Cory, MD   1 year ago Morbid obesity [facility])   [facility] Alba Cory, MD   1 year ago Morbid obesity [facility])   [facility] Alba Cory, MD   1 year ago Enlarged parotid gland   [facility] Alba Cory, MD       Future Appointments             In 1 month Carlynn Purl, Danna Hefty, MD [facility], [facility]

## 2022-12-08 ENCOUNTER — Other Ambulatory Visit: Payer: Self-pay

## 2022-12-08 DIAGNOSIS — Z1211 Encounter for screening for malignant neoplasm of colon: Secondary | ICD-10-CM

## 2022-12-12 ENCOUNTER — Other Ambulatory Visit: Payer: Self-pay | Admitting: Family Medicine

## 2022-12-12 DIAGNOSIS — E785 Hyperlipidemia, unspecified: Secondary | ICD-10-CM

## 2022-12-14 NOTE — Progress Notes (Unsigned)
Name: Madeline Pitts   MRN: [id number]    DOB: Dec 14, 1950   Date:12/15/2022       Progress Note  Subjective  Chief Complaint  Annual exam  HPI  Patient presents for annual CPE.  Diet: she is taking Golo and eating smaller portions.  Exercise: she is physically active   Last Eye Exam: up to date  Last Dental Exam: up to date   Flowsheet Row Clinical Support from 06/19/2022 in [facility]  AUDIT-C Score 0      Depression: Phq 9 is  negative    12/15/2022   11:18 AM 06/19/2022   11:46 AM 03/17/2022   11:06 AM 12/09/2021   11:35 AM 09/23/2021   10:46 AM  Depression screen PHQ 2/9  Decreased Interest 0 0 0 0 0  Down, Depressed, Hopeless 0 0 0 0 0  PHQ - 2 Score 0 0 0 0 0  Altered sleeping 0  0 0   Tired, decreased energy 0  0 0   Change in appetite 0  0 0   Feeling bad or failure about yourself  0  0 0   Trouble concentrating 0  0 0   Moving slowly or fidgety/restless 0  0 0   Suicidal thoughts 0  0 0   PHQ-9 Score 0  0 0    Hypertension: BP Readings from Last 3 Encounters:  12/15/22 116/72  06/19/22 126/68  03/17/22 122/76   Obesity: Wt Readings from Last 3 Encounters:  12/15/22 187 lb (84.8 kg)  06/19/22 196 lb 11.2 oz (89.2 kg)  03/17/22 196 lb (88.9 kg)   BMI Readings from Last 3 Encounters:  12/15/22 34.20 kg/m  06/19/22 35.98 kg/m  03/17/22 35.85 kg/m     Vaccines:   HPV:  Tdap:  Shingrix:  Pneumonia:  Flu:  COVID-19:   Hep C Screening: up to date  STD testing and prevention (HIV/chl/gon/syphilis): N/A Intimate partner violence: negative screen  Sexual History : one sexual partner , no pain  Menstrual History/LMP/Abnormal Bleeding: s/p menopausal  Discussed importance of follow up if any post-menopausal bleeding: no  Incontinence Symptoms: positive for symptoms   Breast cancer:  - Last Mammogram: she will call and schedule  - BRCA gene screening:   Osteoporosis Prevention : Discussed high calcium and vitamin  D supplementation, weight bearing exercises Bone density :yes but due for repeat   Cervical cancer screening: sees gyn , ASCUS , family history of cervical cancer   Skin cancer: Discussed monitoring for atypical lesions  Colorectal cancer: she is waiting for the date    Lung cancer:  Low Dose CT Chest recommended if Age 83-80 years, 20 pack-year currently smoking OR have quit w/in 15years. Patient does not qualify for screen   ECG: 2022  Advanced Care Planning: A voluntary discussion about advance care planning including the explanation and discussion of advance directives.  Discussed health care proxy and Living will, and the patient was able to identify a health care proxy as husband .  Patient does not have a living will and power of attorney of health care   Lipids: Lab Results  Component Value Date   CHOL 137 03/17/2022   CHOL 216 (H) 09/18/2021   CHOL 225 (H) 12/03/2020   Lab Results  Component Value Date   HDL 49 (L) 03/17/2022   HDL 51 09/18/2021   HDL 51 12/03/2020   Lab Results  Component Value Date   LDLCALC 65 03/17/2022  LDLCALC 145 (H) 09/18/2021   LDLCALC 148 (H) 12/03/2020   Lab Results  Component Value Date   TRIG 142 03/17/2022   TRIG 92 09/18/2021   TRIG 139 12/03/2020   Lab Results  Component Value Date   CHOLHDL 2.8 03/17/2022   CHOLHDL 4.2 09/18/2021   CHOLHDL 4.4 12/03/2020   No results found for: "LDLDIRECT"  Glucose: Glucose, Bld  Date Value Ref Range Status  03/17/2022 115 (H) 65 - 99 mg/dL Final    Comment:    .            Fasting reference interval . For someone without known diabetes, a glucose value between 100 and 125 mg/dL is consistent with prediabetes and should be confirmed with a follow-up test. .   09/18/2021 99 65 - 99 mg/dL Final    Comment:    .            Fasting reference interval .   03/03/2021 151 (H) 70 - 99 mg/dL Final    Comment:    Glucose reference range applies only to samples taken after fasting for  at least 8 hours.    Patient Active Problem List   Diagnosis Date Noted   Morbid obesity (HCC) 09/23/2021   Thyroid enlargement 09/23/2021   Enlarged parotid gland 09/23/2021   Iron deficiency 09/23/2021   Hyperglycemia 09/23/2021   Other neutropenia (HCC) 06/24/2021   Pre-diabetes 08/26/2016   Dyslipidemia 08/26/2016   Fibroid 04/25/2015   Essential hypertension 04/25/2015   Menopausal symptom 09/08/2012   Adult BMI 30+ 12/16/2010    Past Surgical History:  Procedure Laterality Date   BREAST BIOPSY Right 09/26/2020   stereo biopsy/ coil clip/ benign   BREAST BIOPSY Right 09/26/2020   stereo biopsy/ coil clip/ benign   COLONOSCOPY  2009?   Dr Thurmond Butts   COLONOSCOPY WITH PROPOFOL N/A 10/06/2017   Procedure: COLONOSCOPY WITH PROPOFOL;  Surgeon: Earline Mayotte, MD;  Location: [facility];  Service: Endoscopy;  Laterality: N/A;   ENDOMETRIAL ABLATION      Family History  Problem Relation Age of Onset   Uterine cancer Mother    Gout Father    COPD Father    Emphysema Father        Was a heavy smoker   Lung cancer Brother        smoker   Lung cancer Brother    Breast cancer Neg Hx     Social History   Socioeconomic History   Marital status: Married    Spouse name: Bernette Mayers    Number of children: 2   Years of education: Not on file   Highest education level: Bachelor's degree (e.g., BA, AB, BS)  Occupational History   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   Tobacco Use   Smoking status: Never   Smokeless tobacco: Never  Vaping Use   Vaping status: Never Used  Substance and Sexual Activity   Alcohol use: No    Alcohol/week: 0.0 standard drinks of alcohol   Drug use: No   Sexual activity: Yes    Partners: Male    Birth control/protection: Post-menopausal  Other Topics Concern   Not on file  Social History Narrative   Not on file   Social Determinants of Health   Financial Resource Strain: Low Risk  (12/15/2022)   Overall Financial Resource  Strain (CARDIA)    Difficulty of Paying Living Expenses: Not hard at all  Food Insecurity: No Food Insecurity (12/15/2022)   Hunger Vital  Sign    Worried About Programme researcher, broadcasting/film/video in the Last Year: Never true    Ran Out of Food in the Last Year: Never true  Transportation Needs: No Transportation Needs (12/15/2022)   PRAPARE - Administrator, Civil Service (Medical): No    Lack of Transportation (Non-Medical): No  Physical Activity: Sufficiently Active (12/15/2022)   Exercise Vital Sign    Days of Exercise per Week: 5 days    Minutes of Exercise per Session: 40 min  Stress: No Stress Concern Present (12/15/2022)   [employer] of Occupational Health - Occupational Stress Questionnaire    Feeling of Stress : Not at all  Social Connections: Socially Integrated (12/15/2022)   Social Connection and Isolation Panel [NHANES]    Frequency of Communication with Friends and Family: More than three times a week    Frequency of Social Gatherings with Friends and Family: More than three times a week    Attends Religious Services: More than 4 times per year    Active Member of [employer] or Organizations: Yes    Attends Engineer, structural: More than 4 times per year    Marital Status: Married  Catering manager Violence: Not At Risk (12/15/2022)   Humiliation, Afraid, Rape, and Kick questionnaire    Fear of Current or Ex-Partner: No    Emotionally Abused: No    Physically Abused: No    Sexually Abused: No     Current Outpatient Medications:    ammonium lactate (LAC-HYDRIN) 12 % lotion, APPLY ONE APPLICATION TOPICALLY TO AFFECTED AREA(S) AS NEEDED FOR DRY SKIN, Disp: 400 mL, Rfl: 0   atorvastatin (LIPITOR) 20 MG tablet, TAKE 1 TABLET BY MOUTH EVERY DAY, Disp: 90 tablet, Rfl: 0   Ginseng 250 MG CAPS, Take by mouth., Disp: , Rfl:    GLUCOSAMINE SULFATE-MSM PO, Take 2 tablets by mouth once., Disp: , Rfl:    hydrocortisone 2.5 % cream, APPLY TOPICALLY TWICE A DAY, Disp: 28 g, Rfl: 0    Iron, Ferrous Sulfate, 325 (65 Fe) MG TABS, Take 325 mg by mouth daily., Disp: 30 tablet, Rfl: 5   losartan-hydrochlorothiazide (HYZAAR) 50-12.5 MG tablet, Take 1 tablet by mouth daily., Disp: 90 tablet, Rfl: 1   Multiple Vitamin (MULTI-VITAMINS) TABS, Take by mouth., Disp: , Rfl:    Omega-3 Fatty Acids (FISH OIL) 1000 MG CAPS, Take 1 capsule by mouth 3 (three) times a week., Disp: , Rfl:    PREMPRO 0.3-1.5 MG tablet, Take 1 tablet by mouth daily., Disp: , Rfl:    TURMERIC PO, Take by mouth., Disp: , Rfl:    Turmeric, Curcuma Longa, (CURCUMIN) POWD, Take by mouth., Disp: , Rfl:    zinc gluconate 50 MG tablet, Take 50 mg by mouth 2 (two) times a week., Disp: , Rfl:   Allergies  Allergen Reactions   Codeine Nausea Only and Other (See Comments)    Makes patient feel lethargic-can't move   Penicillins Nausea Only     ROS  Constitutional: Negative for fever or weight change.  Respiratory: Negative for cough and shortness of breath.   Cardiovascular: Negative for chest pain or palpitations.  Gastrointestinal: Negative for abdominal pain, no bowel changes.  Musculoskeletal: Negative for gait problem or joint swelling.  Skin: Negative for rash.  Neurological: Negative for dizziness or headache.  No other specific complaints in a complete review of systems (except as listed in HPI above).   Objective  Vitals:   12/15/22 1116  BP: 116/72  Pulse: 80  Resp: 16  Temp: 97.9 F (36.6 C)  TempSrc: Oral  SpO2: 95%  Weight: 187 lb (84.8 kg)  Height: 5\' 2"  (1.575 m)    Body mass index is 34.2 kg/m.  Physical Exam  Constitutional: Patient appears well-developed and well-nourished. No distress.  HENT: Head: Normocephalic and atraumatic. Ears: B TMs ok, no erythema or effusion; Nose: Nose normal. Mouth/Throat: Oropharynx is clear and moist. No oropharyngeal exudate.  Eyes: Conjunctivae and EOM are normal. Pupils are equal, round, and reactive to light. No scleral icterus.  Neck: Normal  range of motion. Neck supple. No JVD present. No thyromegaly present.  Cardiovascular: Normal rate, regular rhythm and normal heart sounds.  No murmur heard. No BLE edema. Pulmonary/Chest: Effort normal and breath sounds normal. No respiratory distress. Abdominal: Soft. Bowel sounds are normal, no distension. There is no tenderness. no masses Breast: no lumps or masses, no nipple discharge or rashes FEMALE GENITALIA:  Not done  RECTAL: not done  Musculoskeletal: Normal range of motion, no joint effusions. No gross deformities Neurological: he is alert and oriented to person, place, and time. No cranial nerve deficit. Coordination, balance, strength, speech and gait are normal.  Skin: Skin is warm and dry. No rash noted. No erythema.  Psychiatric: Patient has a normal mood and affect. behavior is normal. Judgment and thought content normal.    Fall Risk:    12/15/2022   11:20 AM 06/19/2022   11:39 AM 03/17/2022   11:06 AM 12/09/2021   11:35 AM 09/23/2021   10:38 AM  Fall Risk   Falls in the past year? 0 0 0 0 0  Number falls in past yr:  0 0 0   Injury with Fall?  0 0 0   Risk for fall due to : No Fall Risks No Fall Risks No Fall Risks No Fall Risks No Fall Risks  Follow up Falls prevention discussed;Education provided;Falls evaluation completed Education provided;Falls prevention discussed Falls prevention discussed Falls prevention discussed Falls prevention discussed     Functional Status Survey: Is the patient deaf or have difficulty hearing?: No Does the patient have difficulty seeing, even when wearing glasses/contacts?: No Does the patient have difficulty concentrating, remembering, or making decisions?: No Does the patient have difficulty walking or climbing stairs?: No Does the patient have difficulty dressing or bathing?: No Does the patient have difficulty doing errands alone such as visiting a doctor's office or shopping?: No   Assessment & Plan  1. Well adult  exam   2. Dyslipidemia  - Lipid panel  3. Other neutropenia (HCC)  - CBC with Differential/Platelet  4. Essential hypertension  - CBC with Differential/Platelet - COMPLETE METABOLIC PANEL WITH GFR  5. Vitamin D deficiency  - VITAMIN D 25 Hydroxy (Vit-D Deficiency, Fractures)  6. Hyperglycemia  - Hemoglobin A1c  7. History of iron deficiency  - Iron, TIBC and Ferritin Panel  8. Ovarian failure  - DG Bone Density; Future   -USPSTF grade A and B recommendations reviewed with patient; age-appropriate recommendations, preventive care, screening tests, etc discussed and encouraged; healthy living encouraged; see AVS for patient education given to patient -Discussed importance of 150 minutes of physical activity weekly, eat two servings of fish weekly, eat one serving of tree nuts ( cashews, pistachios, pecans, almonds.Marland Kitchen) every other day, eat 6 servings of fruit/vegetables daily and drink plenty of water and avoid sweet beverages.   -Reviewed Health Maintenance: Yes.

## 2022-12-15 ENCOUNTER — Encounter: Payer: Self-pay | Admitting: Family Medicine

## 2022-12-15 ENCOUNTER — Ambulatory Visit (INDEPENDENT_AMBULATORY_CARE_PROVIDER_SITE_OTHER): Payer: PPO | Admitting: Family Medicine

## 2022-12-15 VITALS — BP 116/72 | HR 80 | Temp 97.9°F | Resp 16 | Ht 62.0 in | Wt 187.0 lb

## 2022-12-15 DIAGNOSIS — E2839 Other primary ovarian failure: Secondary | ICD-10-CM

## 2022-12-15 DIAGNOSIS — D708 Other neutropenia: Secondary | ICD-10-CM

## 2022-12-15 DIAGNOSIS — Z8639 Personal history of other endocrine, nutritional and metabolic disease: Secondary | ICD-10-CM

## 2022-12-15 DIAGNOSIS — I10 Essential (primary) hypertension: Secondary | ICD-10-CM

## 2022-12-15 DIAGNOSIS — R739 Hyperglycemia, unspecified: Secondary | ICD-10-CM | POA: Diagnosis not present

## 2022-12-15 DIAGNOSIS — Z0000 Encounter for general adult medical examination without abnormal findings: Secondary | ICD-10-CM | POA: Diagnosis not present

## 2022-12-15 DIAGNOSIS — E559 Vitamin D deficiency, unspecified: Secondary | ICD-10-CM | POA: Diagnosis not present

## 2022-12-15 DIAGNOSIS — E785 Hyperlipidemia, unspecified: Secondary | ICD-10-CM | POA: Diagnosis not present

## 2022-12-15 LAB — CBC WITH DIFFERENTIAL/PLATELET
Absolute Monocytes: 262 cells/uL (ref 200–950)
Basophils Absolute: 19 cells/uL (ref 0–200)
Basophils Relative: 0.6 %
Eosinophils Absolute: 61 cells/uL (ref 15–500)
Eosinophils Relative: 1.9 %
HCT: 38.1 % (ref 35.0–45.0)
Hemoglobin: 12.5 g/dL (ref 11.7–15.5)
Lymphs Abs: 1034 cells/uL (ref 850–3900)
MCH: 27.7 pg (ref 27.0–33.0)
MCHC: 32.8 g/dL (ref 32.0–36.0)
MCV: 84.5 fL (ref 80.0–100.0)
MPV: 10.2 fL (ref 7.5–12.5)
Monocytes Relative: 8.2 %
Neutro Abs: 1824 cells/uL (ref 1500–7800)
Neutrophils Relative %: 57 %
Platelets: 299 10*3/uL (ref 140–400)
RBC: 4.51 10*6/uL (ref 3.80–5.10)
RDW: 14.2 % (ref 11.0–15.0)
Total Lymphocyte: 32.3 %
WBC: 3.2 10*3/uL — ABNORMAL LOW (ref 3.8–10.8)

## 2023-01-14 ENCOUNTER — Other Ambulatory Visit: Payer: PPO

## 2023-01-20 ENCOUNTER — Other Ambulatory Visit: Payer: Self-pay | Admitting: Otolaryngology

## 2023-01-20 DIAGNOSIS — E041 Nontoxic single thyroid nodule: Secondary | ICD-10-CM

## 2023-01-26 ENCOUNTER — Ambulatory Visit
Admission: RE | Admit: 2023-01-26 | Discharge: 2023-01-26 | Disposition: A | Discharge status: Home / Self Care | Payer: PPO | Source: Ambulatory Visit | Attending: Otolaryngology | Admitting: Otolaryngology

## 2023-01-26 DIAGNOSIS — E042 Nontoxic multinodular goiter: Secondary | ICD-10-CM | POA: Diagnosis not present

## 2023-01-26 DIAGNOSIS — E041 Nontoxic single thyroid nodule: Secondary | ICD-10-CM

## 2023-01-29 ENCOUNTER — Telehealth: Payer: Self-pay | Admitting: Gastroenterology

## 2023-01-29 NOTE — Telephone Encounter (Signed)
Good afternoon Dr. Barron Alvine,    We received a call from this patient wishing to schedule an appointment for a colonoscopy. Patient states she last had one in 2019 with [facility]. She has since then mvoed to [facility] and her husband is an established patient with you. Records are in Epic for you to review. Would you please advise on scheduling?   Thank you.

## 2023-02-01 ENCOUNTER — Other Ambulatory Visit: Payer: Self-pay | Admitting: Otolaryngology

## 2023-02-01 DIAGNOSIS — E041 Nontoxic single thyroid nodule: Secondary | ICD-10-CM

## 2023-02-04 ENCOUNTER — Encounter: Payer: Self-pay | Admitting: Gastroenterology

## 2023-02-23 ENCOUNTER — Encounter: Payer: Self-pay | Admitting: Gastroenterology

## 2023-02-23 ENCOUNTER — Ambulatory Visit (AMBULATORY_SURGERY_CENTER): Payer: PPO | Admitting: *Deleted

## 2023-02-23 VITALS — Ht 62.0 in | Wt 185.0 lb

## 2023-02-23 DIAGNOSIS — Z860100 Personal history of colon polyps, unspecified: Secondary | ICD-10-CM

## 2023-02-23 MED ORDER — NA SULFATE-K SULFATE-MG SULF 17.5-3.13-1.6 GM/177ML PO SOLN: 1.0000 | Freq: Once | ORAL | 0 refills | Status: DC

## 2023-02-23 NOTE — Progress Notes (Signed)
Pt's name and DOB verified at the beginning of the pre-visit wit 2 identifiers  Pt denies any difficulty with ambulating,sitting, laying down or rolling side to side  Gave both [facility] main # and MD on call # prior to instructions.   No egg or soy allergy known to patient   No issues known to pt with past sedation with any surgeries or procedures  Pt denies having issues being intubated  Pt has no issues moving head neck or swallowing  No FH of Malignant Hyperthermia  Pt is not on diet pills or shots  Pt is not on home 02   Pt is not on blood thinners   Pt denies issues with constipation   Pt is not on dialysis  Pt denise any abnormal heart rhythms   Pt denies any upcoming cardiac testing  Pt encouraged to use to use Singlecare or Goodrx to reduce cost   Patient's chart reviewed by Madeline Pitts CNRA prior to pre-visit and patient appropriate for the [facility].  Pre-visit completed and red dot placed by patient's name on their procedure day (on provider's schedule).  .  Visit by phone  Pt states weight is 185 lb  Instructed pt why it is important to and  to call if they have any changes in health or new medications. Directed them to the # given and on instructions.     Instructions reviewed with pt and pt states understanding. Instructed to review again prior to procedure. Pt states they will.   Instructions sent by mail with coupon and by my chart

## 2023-02-25 ENCOUNTER — Telehealth: Payer: Self-pay | Admitting: Gastroenterology

## 2023-02-25 ENCOUNTER — Other Ambulatory Visit: Payer: Self-pay

## 2023-02-25 DIAGNOSIS — Z860100 Personal history of colon polyps, unspecified: Secondary | ICD-10-CM

## 2023-02-25 MED ORDER — NA SULFATE-K SULFATE-MG SULF 17.5-3.13-1.6 GM/177ML PO SOLN: 1.0000 | Freq: Once | ORAL | 0 refills | Status: AC

## 2023-02-25 NOTE — Telephone Encounter (Signed)
Rx resent verified with pharmacy to fill the suprep solution and not PEG solution. Per [facility] is ready for pick up. Pt is aware

## 2023-02-25 NOTE — Telephone Encounter (Signed)
Inbound call from patient stating the prep that was called into her pharmacy is different than what was discussed at 10/15 PV. Patient requesting a call to confirm medication. Please advise, thank you.

## 2023-03-04 ENCOUNTER — Other Ambulatory Visit: Payer: PPO

## 2023-03-09 ENCOUNTER — Telehealth: Payer: Self-pay | Admitting: Gastroenterology

## 2023-03-09 NOTE — Telephone Encounter (Signed)
Pt asking questions about what she can and can have for 5 days prior and some medication questions as to what she can and can't have. RN reviewed instructions with pt. Pt stated after that she had no other questions at this time and understood what was taught.

## 2023-03-09 NOTE — Telephone Encounter (Signed)
Inbound call from patient, states she wants to know "if her chart has everything needed for the procedure" would like to speak with a nurse.

## 2023-03-12 ENCOUNTER — Ambulatory Visit: Payer: PPO | Admitting: Gastroenterology

## 2023-03-12 ENCOUNTER — Other Ambulatory Visit: Payer: Self-pay | Admitting: Family Medicine

## 2023-03-12 ENCOUNTER — Encounter: Payer: Self-pay | Admitting: Gastroenterology

## 2023-03-12 VITALS — BP 114/66 | HR 53 | Temp 97.2°F | Resp 10 | Ht 62.0 in | Wt 185.0 lb

## 2023-03-12 DIAGNOSIS — D122 Benign neoplasm of ascending colon: Secondary | ICD-10-CM

## 2023-03-12 DIAGNOSIS — Z09 Encounter for follow-up examination after completed treatment for conditions other than malignant neoplasm: Secondary | ICD-10-CM

## 2023-03-12 DIAGNOSIS — K5730 Diverticulosis of large intestine without perforation or abscess without bleeding: Secondary | ICD-10-CM | POA: Diagnosis not present

## 2023-03-12 DIAGNOSIS — K641 Second degree hemorrhoids: Secondary | ICD-10-CM

## 2023-03-12 DIAGNOSIS — D121 Benign neoplasm of appendix: Secondary | ICD-10-CM | POA: Diagnosis not present

## 2023-03-12 DIAGNOSIS — D123 Benign neoplasm of transverse colon: Secondary | ICD-10-CM | POA: Diagnosis not present

## 2023-03-12 DIAGNOSIS — I10 Essential (primary) hypertension: Secondary | ICD-10-CM | POA: Diagnosis not present

## 2023-03-12 DIAGNOSIS — Z860101 Personal history of adenomatous and serrated colon polyps: Secondary | ICD-10-CM

## 2023-03-12 DIAGNOSIS — D120 Benign neoplasm of cecum: Secondary | ICD-10-CM | POA: Diagnosis not present

## 2023-03-12 DIAGNOSIS — Z860100 Personal history of colon polyps, unspecified: Secondary | ICD-10-CM

## 2023-03-12 DIAGNOSIS — R7303 Prediabetes: Secondary | ICD-10-CM | POA: Diagnosis not present

## 2023-03-12 DIAGNOSIS — E785 Hyperlipidemia, unspecified: Secondary | ICD-10-CM

## 2023-03-12 DIAGNOSIS — C474 Malignant neoplasm of peripheral nerves of abdomen: Secondary | ICD-10-CM | POA: Diagnosis not present

## 2023-03-12 DIAGNOSIS — D125 Benign neoplasm of sigmoid colon: Secondary | ICD-10-CM

## 2023-03-12 DIAGNOSIS — E669 Obesity, unspecified: Secondary | ICD-10-CM | POA: Diagnosis not present

## 2023-03-12 MED ORDER — SODIUM CHLORIDE 0.9 % IV SOLN: 500.0000 mL | Freq: Once | INTRAVENOUS | Status: DC

## 2023-03-12 NOTE — Progress Notes (Signed)
Vss nad trans to pacu 

## 2023-03-12 NOTE — Patient Instructions (Signed)
You may resume all of your previous medications today as ordered.  Read all of the handouts given to you by your recovery room nurse.  Try to increase the fiber in your diet, and drink plenty of water.  YOU HAD AN ENDOSCOPIC PROCEDURE TODAY AT THE [facility]:   Refer to the procedure report that was given to you for any specific questions about what was found during the examination.  If the procedure report does not answer your questions, please call your gastroenterologist to clarify.  If you requested that your care partner not be given the details of your procedure findings, then the procedure report has been included in a sealed envelope for you to review at your convenience later.  YOU SHOULD EXPECT: Some feelings of bloating in the abdomen. Passage of more gas than usual.  Walking can help get rid of the air that was put into your GI tract during the procedure and reduce the bloating. If you had a lower endoscopy (such as a colonoscopy or flexible sigmoidoscopy) you may notice spotting of blood in your stool or on the toilet paper. If you underwent a bowel prep for your procedure, you may not have a normal bowel movement for a few days.  Please Note:  You might notice some irritation and congestion in your nose or some drainage.  This is from the oxygen used during your procedure.  There is no need for concern and it should clear up in a day or so.  SYMPTOMS TO REPORT IMMEDIATELY:  Following lower endoscopy (colonoscopy or flexible sigmoidoscopy):  Excessive amounts of blood in the stool  Significant tenderness or worsening of abdominal pains  Swelling of the abdomen that is new, acute  Fever of 100F or higher   For urgent or emergent issues, a gastroenterologist can be reached at any hour by calling [phone]. Do not use MyChart messaging for urgent concerns.    DIET:  We do recommend a small meal at first, but then you may proceed to your regular diet.  Drink plenty  of fluids but you should avoid alcoholic beverages for 24 hours.  ACTIVITY:  You should plan to take it easy for the rest of today and you should NOT DRIVE or use heavy machinery until tomorrow (because of the sedation medicines used during the test).    FOLLOW UP: Our staff will call the number listed on your records the next business day following your procedure.  We will call around 7:15- 8:00 am to check on you and address any questions or concerns that you may have regarding the information given to you following your procedure. If we do not reach you, we will leave a message.     If any biopsies were taken you will be contacted by phone or by letter within the next 1-3 weeks.  Please call us at [phone] if you have not heard about the biopsies in 3 weeks.    SIGNATURES/CONFIDENTIALITY: You and/or your care partner have signed paperwork which will be entered into your electronic medical record.  These signatures attest to the fact that that the information above on your After Visit Summary has been reviewed and is understood.  Full responsibility of the confidentiality of this discharge information lies with you and/or your care-partner.

## 2023-03-12 NOTE — Progress Notes (Signed)
Called to room to assist during endoscopic procedure.  Patient ID and intended procedure confirmed with present staff. Received instructions for my participation in the procedure from the performing physician.  

## 2023-03-12 NOTE — Op Note (Signed)
[facility] Patient Name: Madeline Pitts Procedure Date: 03/12/2023 2:16 PM MRN: [id number] Endoscopist: Doristine Locks , MD, [id number] Age: 72 Referring MD:  Date of Birth: 1951-04-10 Gender: Female Account #: [id number] Procedure:                Colonoscopy Indications:              Surveillance: Personal history of adenomatous                            polyps on last colonoscopy 5 years ago                           Last colonoscopy was 09/2017 at [facility]                            and notable for 3 sessile polyps each 5 mm (adenoma                            x 1, benign -2). Medicines:                Monitored Anesthesia Care Procedure:                Pre-Anesthesia Assessment:                           - Prior to the procedure, a History and Physical                            was performed, and patient medications and                            allergies were reviewed. The patient's tolerance of                            previous anesthesia was also reviewed. The risks                            and benefits of the procedure and the sedation                            options and risks were discussed with the patient.                            All questions were answered, and informed consent                            was obtained. Prior Anticoagulants: The patient has                            taken no anticoagulant or antiplatelet agents. ASA                            Grade Assessment: II - A patient with mild systemic  disease. After reviewing the risks and benefits,                            the patient was deemed in satisfactory condition to                            undergo the procedure.                           After obtaining informed consent, the colonoscope                            was passed under direct vision. Throughout the                            procedure, the patient's blood pressure, pulse, and                             oxygen saturations were monitored continuously. The                            Olympus Scope [id number] was introduced through the                            anus and advanced to the the cecum, identified by                            appendiceal orifice and ileocecal valve. The                            colonoscopy was performed without difficulty. The                            patient tolerated the procedure well. The quality                            of the bowel preparation was good. The ileocecal                            valve, appendiceal orifice, and rectum were                            photographed. Scope In: 2:26:43 PM Scope Out: 2:50:49 PM Scope Withdrawal Time: 0 hours 19 minutes 3 seconds  Total Procedure Duration: 0 hours 24 minutes 6 seconds  Findings:                 Skin tags were found on perianal exam.                           A 2 mm polyp was found in the appendiceal orifice.                            The polyp was sessile. The polyp was removed with a  cold biopsy forceps. Resection and retrieval were                            complete. Estimated blood loss was minimal.                           Four sessile polyps were found in the transverse                            colon (2), ascending colon, and cecum. The polyps                            were 3 to 5 mm in size. These polyps were removed                            with a cold snare. Resection and retrieval were                            complete. Estimated blood loss was minimal.                           A 3 mm polyp was found in the sigmoid colon. The                            polyp was sessile. The polyp was removed with a                            cold snare. Resection and retrieval were complete.                            Estimated blood loss was minimal.                           Multiple small-mouthed diverticula were found in                             the sigmoid colon, transverse colon and ascending                            colon.                           There was a small lipoma, in the ascending colon.                           Non-bleeding internal hemorrhoids were found during                            retroflexion. The hemorrhoids were small. Complications:            No immediate complications. Estimated Blood Loss:     Estimated blood loss was minimal. Impression:               - Perianal skin tags found on perianal exam.                           -  One 2 mm polyp at the appendiceal orifice,                            removed with a cold biopsy forceps. Resected and                            retrieved.                           - Four 3 to 5 mm polyps in the transverse colon, in                            the ascending colon and in the cecum, removed with                            a cold snare. Resected and retrieved.                           - One 3 mm polyp in the sigmoid colon, removed with                            a cold snare. Resected and retrieved.                           - Diverticulosis in the sigmoid colon, in the                            transverse colon and in the ascending colon.                           - Small lipoma in the ascending colon.                           - Non-bleeding internal hemorrhoids. Recommendation:           - Patient has a contact number available for                            emergencies. The signs and symptoms of potential                            delayed complications were discussed with the                            patient. Return to normal activities tomorrow.                            Written discharge instructions were provided to the                            patient.                           - Resume previous diet.                           -  Continue present medications.                           - Await pathology results.                            - Repeat colonoscopy for surveillance based on                            pathology results.                           - Return to GI clinic PRN. Doristine Locks, MD 03/12/2023 2:56:48 PM

## 2023-03-12 NOTE — Progress Notes (Signed)
GASTROENTEROLOGY PROCEDURE H&P NOTE   Primary Care Physician: Alba Cory, MD    Reason for Procedure:  Colon polyp surveillance  Plan:    Colonoscopy  Patient is appropriate for endoscopic procedure(s) in the ambulatory ([facility]) setting.  The nature of the procedure, as well as the risks, benefits, and alternatives were carefully and thoroughly reviewed with the patient. Ample time for discussion and questions allowed. The patient understood, was satisfied, and agreed to proceed.     HPI: Madeline Pitts is a 72 y.o. female who presents for colonoscopy for ongoing colon polyp surveillance and colon cancer screening.  No active GI symptoms.  No known family history of colon cancer or related malignancy.  Patient is otherwise without complaints or active issues today.  Last colonoscopy was 09/2017 at [facility] and notable for 3 sessile polyps each 5 mm (adenoma x 1, benign 2), with recommendation to repeat in 5 years.  Past Medical History:  Diagnosis Date   Hyperlipidemia    Hypertension    Obesity    Prediabetes    Vitamin D deficiency     Past Surgical History:  Procedure Laterality Date   BREAST BIOPSY Right 09/26/2020   stereo biopsy/ coil clip/ benign   BREAST BIOPSY Right 09/26/2020   stereo biopsy/ coil clip/ benign   COLONOSCOPY  2009?   Dr Thurmond Butts   COLONOSCOPY WITH PROPOFOL N/A 10/06/2017   Procedure: COLONOSCOPY WITH PROPOFOL;  Surgeon: Earline Mayotte, MD;  Location: [facility] ENDOSCOPY;  Service: Endoscopy;  Laterality: N/A;   ENDOMETRIAL ABLATION      Prior to Admission medications   Medication Sig Start Date End Date Taking? Authorizing Provider  ammonium lactate (LAC-HYDRIN) 12 % lotion APPLY ONE APPLICATION TOPICALLY TO AFFECTED AREA(S) AS NEEDED FOR DRY SKIN 11/11/22  Yes Sowles, Danna Hefty, MD  atorvastatin (LIPITOR) 20 MG tablet TAKE 1 TABLET BY MOUTH EVERY DAY 03/12/23  Yes Sowles, Danna Hefty, MD  Ginseng 250 MG CAPS Take by mouth.   Yes  [provider]  GLUCOSAMINE SULFATE-MSM PO Take 2 tablets by mouth once.   Yes [provider]  losartan-hydrochlorothiazide (HYZAAR) 50-12.5 MG tablet Take 1 tablet by mouth daily. 09/28/22  Yes Alba Cory, MD  Multiple Vitamin (MULTI-VITAMINS) TABS Take by mouth.   Yes [provider]  Omega-3 Fatty Acids (FISH OIL) 1000 MG CAPS Take 1 capsule by mouth 3 (three) times a week.   Yes [provider]  PREMPRO 0.3-1.5 MG tablet Take 1 tablet by mouth daily. 06/06/21  Yes [provider]  TURMERIC PO Take by mouth.   Yes [provider]  zinc gluconate 50 MG tablet Take 50 mg by mouth 2 (two) times a week.   Yes [provider]  hydrocortisone 2.5 % cream APPLY TOPICALLY TWICE A DAY Patient not taking: Reported on 02/23/2023 11/12/21   Alba Cory, MD  Iron, Ferrous Sulfate, 325 (65 Fe) MG TABS Take 325 mg by mouth daily. 01/17/21   Alba Cory, MD  Turmeric, Corwin Levins, (CURCUMIN) POWD Take by mouth. Patient not taking: Reported on 02/23/2023    [provider]    Current Outpatient Medications  Medication Sig Dispense Refill   ammonium lactate (LAC-HYDRIN) 12 % lotion APPLY ONE APPLICATION TOPICALLY TO AFFECTED AREA(S) AS NEEDED FOR DRY SKIN 400 mL 0   atorvastatin (LIPITOR) 20 MG tablet TAKE 1 TABLET BY MOUTH EVERY DAY 90 tablet 0   Ginseng 250 MG CAPS Take by mouth.     GLUCOSAMINE SULFATE-MSM  PO Take 2 tablets by mouth once.     losartan-hydrochlorothiazide (HYZAAR) 50-12.5 MG tablet Take 1 tablet by mouth daily. 90 tablet 1   Multiple Vitamin (MULTI-VITAMINS) TABS Take by mouth.     Omega-3 Fatty Acids (FISH OIL) 1000 MG CAPS Take 1 capsule by mouth 3 (three) times a week.     PREMPRO 0.3-1.5 MG tablet Take 1 tablet by mouth daily.     TURMERIC PO Take by mouth.     zinc gluconate 50 MG tablet Take 50 mg by mouth 2 (two) times a week.     hydrocortisone 2.5 % cream APPLY TOPICALLY TWICE A DAY (Patient not  taking: Reported on 02/23/2023) 28 g 0   Iron, Ferrous Sulfate, 325 (65 Fe) MG TABS Take 325 mg by mouth daily. 30 tablet 5   Turmeric, Curcuma Longa, (CURCUMIN) POWD Take by mouth. (Patient not taking: Reported on 02/23/2023)     Current Facility-Administered Medications  Medication Dose Route Frequency Provider Last Rate Last Admin   0.9 %  sodium chloride infusion  500 mL Intravenous Once Timmya Blazier V, DO        Allergies as of 03/12/2023 - Review Complete 03/12/2023  Allergen Reaction Noted   Codeine Nausea Only and Other (See Comments) 05/26/2016   Penicillins Nausea Only 04/25/2015    Family History  Problem Relation Age of Onset   Uterine cancer Mother    Gout Father    COPD Father    Emphysema Father        Was a heavy smoker   Lung cancer Brother        smoker   Lung cancer Brother    Breast cancer Neg Hx    Colon cancer Neg Hx    Colon polyps Neg Hx    Esophageal cancer Neg Hx    Rectal cancer Neg Hx    Stomach cancer Neg Hx     Social History   Socioeconomic History   Marital status: Married    Spouse name: Bernette Mayers    Number of children: 2   Years of education: Not on file   Highest education level: Bachelor's degree (e.g., BA, AB, BS)  Occupational History   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   Tobacco Use   Smoking status: Never   Smokeless tobacco: Never  Vaping Use   Vaping status: Never Used  Substance and Sexual Activity   Alcohol use: No    Alcohol/week: 0.0 standard drinks of alcohol   Drug use: No   Sexual activity: Yes    Partners: Male    Birth control/protection: Post-menopausal  Other Topics Concern   Not on file  Social History Narrative   Not on file   Social Determinants of Health   Financial Resource Strain: Low Risk  (12/15/2022)   Overall Financial Resource Strain (CARDIA)    Difficulty of Paying Living Expenses: Not hard at all  Food Insecurity: No Food Insecurity (12/15/2022)   Hunger Vital Sign    Worried  About Running Out of Food in the Last Year: Never true    Ran Out of Food in the Last Year: Never true  Transportation Needs: No Transportation Needs (12/15/2022)   PRAPARE - Administrator, Civil Service (Medical): No    Lack of Transportation (Non-Medical): No  Physical Activity: Sufficiently Active (12/15/2022)   Exercise Vital Sign    Days of Exercise per Week: 5 days    Minutes of Exercise per Session: 40 min  Stress: No Stress Concern Present (12/15/2022)   [employer] of Occupational Health - Occupational Stress Questionnaire    Feeling of Stress : Not at all  Social Connections: Socially Integrated (12/15/2022)   Social Connection and Isolation Panel [NHANES]    Frequency of Communication with Friends and Family: More than three times a week    Frequency of Social Gatherings with Friends and Family: More than three times a week    Attends Religious Services: More than 4 times per year    Active Member of [employer] or Organizations: Yes    Attends Engineer, structural: More than 4 times per year    Marital Status: Married  Catering manager Violence: Not At Risk (12/15/2022)   Humiliation, Afraid, Rape, and Kick questionnaire    Fear of Current or Ex-Partner: No    Emotionally Abused: No    Physically Abused: No    Sexually Abused: No    Physical Exam: Vital signs in last 24 hours: @BP  (!) 144/95   Pulse 69   Temp (!) 97.2 F (36.2 C)   Ht 5\' 2"  (1.575 m)   Wt 185 lb (83.9 kg)   SpO2 99%   BMI 33.84 kg/m  GEN: NAD EYE: Sclerae anicteric ENT: MMM CV: Non-tachycardic Pulm: CTA b/l GI: Soft, NT/ND NEURO:  Alert & Oriented x 3   Doristine Locks, DO [facility]   03/12/2023 2:20 PM

## 2023-03-15 ENCOUNTER — Telehealth: Payer: Self-pay | Admitting: *Deleted

## 2023-03-15 NOTE — Telephone Encounter (Signed)
  Follow up Call-     03/12/2023    1:57 PM  Call back number  Post procedure Call Back phone  # [phone]  Permission to leave phone message Yes     Patient questions:  Do you have a fever, pain , or abdominal swelling? No. Pain Score  0 *  Have you tolerated food without any problems? Yes.    Have you been able to return to your normal activities? Yes.    Do you have any questions about your discharge instructions: Diet   No. Medications  No. Follow up visit  No.  Do you have questions or concerns about your Care? No.  Actions: * If pain score is 4 or above: No action needed, pain <4.

## 2023-03-18 LAB — SURGICAL PATHOLOGY

## 2023-03-18 NOTE — Progress Notes (Addendum)
Name: Madeline Pitts   MRN: [id number]    DOB: [birth year]   Date:03/19/2023       Progress Note  Subjective  Chief Complaint  Follow Up  HPI  HTN: she has been taking medication as prescribed, and denies side effects. No chest pain, dizziness  or palpitation. BP has been  towards low end of normal, we will split losartan hydrochlorothiazide 50/12.5 , start losartan 25 mg and continue hydrochlorothiazide at 12.5 mg and recheck next visit    Leucopenia: stable over time, between 3.2 and 3.5 over the past year    Obesity:   BMI is below 35 now, losing weight with life style modification. She is doing well   Vitamin D deficiency: taking otc supplementation , currently her MVI has 2000 units of vitamin D   Hyperlipidemia: she started Atorvastatin  early 2024 and LDL is at goal 27   History Iron deficiency anemia: it happened during COVID-19 Summer 2022 , she has been taking iron supplementation and last level improved, colonoscopy up to date . She still take iron pills a few times a week   Enlarge thyroid : under the care of Dr. Andee Poles, she denies dysphagia.    Patient Active Problem List   Diagnosis Date Noted   Morbid obesity (HCC) 09/23/2021   Thyroid enlargement 09/23/2021   Enlarged parotid gland 09/23/2021   Iron deficiency 09/23/2021   Hyperglycemia 09/23/2021   Other neutropenia (HCC) 06/24/2021   Pre-diabetes 08/26/2016   Dyslipidemia 08/26/2016   Fibroid 04/25/2015   Essential hypertension 04/25/2015   Menopausal symptom 09/08/2012   Adult BMI 30+ 12/16/2010    Past Surgical History:  Procedure Laterality Date   BREAST BIOPSY Right 09/26/2020   stereo biopsy/ coil clip/ benign   BREAST BIOPSY Right 09/26/2020   stereo biopsy/ coil clip/ benign   COLONOSCOPY  2009?   Dr Thurmond Butts   COLONOSCOPY WITH PROPOFOL N/A 10/06/2017   Procedure: COLONOSCOPY WITH PROPOFOL;  Surgeon: Earline Mayotte, MD;  Location: [facility] ENDOSCOPY;  Service: Endoscopy;  Laterality: N/A;    ENDOMETRIAL ABLATION      Family History  Problem Relation Age of Onset   Uterine cancer Mother    Gout Father    COPD Father    Emphysema Father        Was a heavy smoker   Lung cancer Brother        smoker   Lung cancer Brother    Breast cancer Neg Hx    Colon cancer Neg Hx    Colon polyps Neg Hx    Esophageal cancer Neg Hx    Rectal cancer Neg Hx    Stomach cancer Neg Hx     Social History   Tobacco Use   Smoking status: Never   Smokeless tobacco: Never  Substance Use Topics   Alcohol use: No    Alcohol/week: 0.0 standard drinks of alcohol     Current Outpatient Medications:    ammonium lactate (LAC-HYDRIN) 12 % lotion, APPLY ONE APPLICATION TOPICALLY TO AFFECTED AREA(S) AS NEEDED FOR DRY SKIN, Disp: 400 mL, Rfl: 0   atorvastatin (LIPITOR) 20 MG tablet, TAKE 1 TABLET BY MOUTH EVERY DAY, Disp: 90 tablet, Rfl: 0   Ginseng 250 MG CAPS, Take by mouth., Disp: , Rfl:    GLUCOSAMINE SULFATE-MSM PO, Take 2 tablets by mouth once., Disp: , Rfl:    Iron, Ferrous Sulfate, 325 (65 Fe) MG TABS, Take 325 mg by mouth daily., Disp: 30 tablet, Rfl: 5  losartan-hydrochlorothiazide (HYZAAR) 50-12.5 MG tablet, Take 1 tablet by mouth daily., Disp: 90 tablet, Rfl: 1   Multiple Vitamin (MULTI-VITAMINS) TABS, Take by mouth., Disp: , Rfl:    Omega-3 Fatty Acids (FISH OIL) 1000 MG CAPS, Take 1 capsule by mouth 3 (three) times a week., Disp: , Rfl:    PREMPRO 0.3-1.5 MG tablet, Take 1 tablet by mouth daily., Disp: , Rfl:    TURMERIC PO, Take by mouth., Disp: , Rfl:    zinc gluconate 50 MG tablet, Take 50 mg by mouth 2 (two) times a week., Disp: , Rfl:   Allergies  Allergen Reactions   Codeine Nausea Only and Other (See Comments)    Makes patient feel lethargic-can't move   Penicillins Nausea Only    I personally reviewed active problem list, medication list, allergies, family history, social history, health maintenance with the patient/caregiver today.   ROS  Ten systems reviewed and  is negative except as mentioned in HPI    Objective  Vitals:   03/19/23 1144  BP: 118/70  Pulse: 81  Resp: 16  SpO2: 97%  Weight: 188 lb (85.3 kg)  Height: 5\' 2"  (1.575 m)    Body mass index is 34.39 kg/m.  Physical Exam  Constitutional: Patient appears well-developed and well-nourished.  No distress.  HEENT: head atraumatic, normocephalic, pupils equal and reactive to light, neck supple, throat within normal limits Cardiovascular: Normal rate, regular rhythm and normal heart sounds.  No murmur heard. No BLE edema. Pulmonary/Chest: Effort normal and breath sounds normal. No respiratory distress. Abdominal: Soft.  There is no tenderness. Psychiatric: Patient has a normal mood and affect. behavior is normal. Judgment and thought content normal.   Recent Results (from the past 2160 hour(s))  Surgical pathology ([facility])     Status: None   Collection Time: 03/12/23 12:00 AM  Result Value Ref Range   SURGICAL PATHOLOGY      SURGICAL PATHOLOGY [facility] [address] [place], [place] [address] Telephone [phone] or [phone] Fax [phone]  REPORT OF SURGICAL PATHOLOGY   Accession #: [id number] Patient Name: Madeline Pitts Visit # : [id number]  MRN: [id number] Physician: Doristine Locks DOB/Age [birth year] (Age: 72) Gender: F Collected Date: 03/12/2023 Received Date: 03/16/2023  FINAL DIAGNOSIS       1. Surgical [P], colon, appendiceal orifice, polyp (1) :       -  BENIGN NEURAL PROLIFERATION MOST CONSISTENT WITH SCHWANNOMA      -  NO DYSPLASIA OR MALIGNANCY IDENTIFIED      -  SEE COMMENT       2. Surgical [P], colon, ascending, transverse, polyp (4) :       -  TUBULAR ADENOMA (3 OF 4 FRAGMENTS)      -  BENIGN COLONIC MUCOSA (1 OF 4 FRAGMENTS)      -  NO HIGH-GRADE DYSPLASIA OR MALIGNANCY IDENTIFIED       3. Surgical [P], colon, sigmoid, polyp (1) :       -  BENIGN NEURAL PROLIFERATION MOST CONSISTENT WITH  SCHWANNOMA      -  N O DYSPLASIA OR MALIGNANCY IDENTIFIED      -  SEE COMMENT       ELECTRONIC SIGNATURE : Charm Barges Md, Dawn, Sports administrator, International aid/development worker  MICROSCOPIC DESCRIPTION 1. and 3.The biopsies consist of benign colonic mucosa with an underlying spindle cell proliferation.By immunohistochemistry, the spindle cell proliferation is positive for S100 but negative for SMA, supporting the diagnosis of  a benign neural proliferation.Overall, the features are more is consistent with a benign schwannoma.  CASE COMMENTS STAINS USED IN DIAGNOSIS: H&E Universal Negative Control-Bond H&E H&E-2 H&E Stains used in diagnosis 1 (a-SMA) Smooth Muscle Actin, 1 *S-100 (Red), 3 (a-SMA) Smooth Muscle Actin, 3 *S-100 (Red) Some of these immunohistochemical stains may have been developed and the performance characteristics determined by [facility].  Some may not have been cleared or approved by the U.S. Food and Drug Administration.  The FDA has determined that such clearance  or approval is not necessary.  This test is used for clinical purposes.  It should not be regarded as investigational or for research.  This laboratory is certified under the Clinical Laboratory Improvement Amendments of 1988 (CLIA-88) as qualified to perform high complexity clinical laboratory testing.    CLINICAL HISTORY  SPECIMEN(S) OBTAINED 1. Surgical [P], Colon, Appendiceal Orifice, Polyp (1) 2. Surgical [P], Colon, Ascending, Transverse, Polyp (4) 3. Surgical [P], Colon, Sigmoid, Polyp (1)  SPECIMEN COMMENTS: 1. History of colonic polyps; benign neoplasm of cecum; benign neoplasm of ascending colon; benign neoplasm of appendix; benign neoplasm of transverse colon; benign neoplasm of sigmoid colon SPECIMEN CLINICAL INFORMATION: 1. R/O adenoma 2. R/O adenoma 3. R/O adenoma    Gross Description 1. Received in formalin are tan, soft tissue fragments that are submitted  in toto.Number: 1 Size: 0.3 cm, (1B) ( TT ) 2. Received in formalin are tan, soft tissu e fragments that are submitted in toto.Number: 4, Size: 0.4 cm smallest to 2.6 cm largest, (1B) ( TT ) 3. Received in formalin are tan, soft tissue fragments that are submitted in toto.Number: 1 Size: 0.7 cm, (1B) ( TT )        Report signed out from the following location(s) [facility] [address], [place] [address] CLIA #: [id number]  [facility] [address] [place], [place] [address] CLIA #: [id number]      PHQ2/9:    03/19/2023   11:44 AM 12/15/2022   11:18 AM 06/19/2022   11:46 AM 03/17/2022   11:06 AM 12/09/2021   11:35 AM  Depression screen PHQ 2/9  Decreased Interest 0 0 0 0 0  Down, Depressed, Hopeless 0 0 0 0 0  PHQ - 2 Score 0 0 0 0 0  Altered sleeping 0 0  0 0  Tired, decreased energy 0 0  0 0  Change in appetite 0 0  0 0  Feeling bad or failure about yourself  0 0  0 0  Trouble concentrating 0 0  0 0  Moving slowly or fidgety/restless 0 0  0 0  Suicidal thoughts 0 0  0 0  PHQ-9 Score 0 0  0 0    phq 9 is negative   Fall Risk:    03/19/2023   11:44 AM 12/15/2022   11:20 AM 06/19/2022   11:39 AM 03/17/2022   11:06 AM 12/09/2021   11:35 AM  Fall Risk   Falls in the past year? 0 0 0 0 0  Number falls in past yr: 0  0 0 0  Injury with Fall? 0  0 0 0  Risk for fall due to : No Fall Risks No Fall Risks No Fall Risks No Fall Risks No Fall Risks  Follow up Falls prevention discussed Falls prevention discussed;Education provided;Falls evaluation completed Education provided;Falls prevention discussed Falls prevention discussed Falls prevention discussed      Functional Status Survey: Is  the patient deaf or have difficulty hearing?: No Does the patient have difficulty seeing, even when wearing glasses/contacts?: No Does the patient have difficulty concentrating, remembering, or making decisions?: No Does the patient have  difficulty walking or climbing stairs?: No Does the patient have difficulty dressing or bathing?: No Does the patient have difficulty doing errands alone such as visiting a doctor's office or shopping?: No  Diabetic Foot Exam - Simple   Simple Foot Form Visual Inspection No deformities, no ulcerations, no other skin breakdown bilaterally: Yes Sensation Testing Intact to touch and monofilament testing bilaterally: Yes Pulse Check Posterior Tibialis and Dorsalis pulse intact bilaterally: Yes Comments      Assessment & Plan  1. Hypertension associated with type 2 diabetes mellitus (HCC)  - Urine Microalbumin w/creat. ratio  2. Essential hypertension  - losartan (COZAAR) 25 MG tablet; Take 1 tablet (25 mg total) by mouth daily.  Dispense: 90 tablet; Refill: 0 - hydrochlorothiazide (HYDRODIURIL) 12.5 MG tablet; Take 1 tablet (12.5 mg total) by mouth daily.  Dispense: 90 tablet; Refill: 0  3. Dyslipidemia  - atorvastatin (LIPITOR) 20 MG tablet; Take 1 tablet (20 mg total) by mouth daily.  Dispense: 90 tablet; Refill: 1    4. Obesity (BMI 30.0-34.9)   She has been exercising for 45 minutes 4-5 times a week, smaller portion, not eating late at night

## 2023-03-19 ENCOUNTER — Ambulatory Visit: Payer: PPO | Admitting: Family Medicine

## 2023-03-19 ENCOUNTER — Encounter: Payer: Self-pay | Admitting: Family Medicine

## 2023-03-19 VITALS — BP 118/70 | HR 81 | Resp 16 | Ht 62.0 in | Wt 188.0 lb

## 2023-03-19 DIAGNOSIS — E66811 Obesity, class 1: Secondary | ICD-10-CM | POA: Insufficient documentation

## 2023-03-19 DIAGNOSIS — E785 Hyperlipidemia, unspecified: Secondary | ICD-10-CM

## 2023-03-19 DIAGNOSIS — I10 Essential (primary) hypertension: Secondary | ICD-10-CM

## 2023-03-19 DIAGNOSIS — E1159 Type 2 diabetes mellitus with other circulatory complications: Secondary | ICD-10-CM | POA: Diagnosis not present

## 2023-03-19 DIAGNOSIS — Z23 Encounter for immunization: Secondary | ICD-10-CM

## 2023-03-19 DIAGNOSIS — I152 Hypertension secondary to endocrine disorders: Secondary | ICD-10-CM | POA: Diagnosis not present

## 2023-03-19 MED ORDER — LOSARTAN POTASSIUM 25 MG PO TABS: 25.0000 mg | ORAL_TABLET | Freq: Every day | ORAL | 0 refills | Status: DC

## 2023-03-19 MED ORDER — ATORVASTATIN CALCIUM 20 MG PO TABS: 20.0000 mg | ORAL_TABLET | Freq: Every day | ORAL | 1 refills | Status: DC

## 2023-03-19 MED ORDER — HYDROCHLOROTHIAZIDE 12.5 MG PO TABS: 12.5000 mg | ORAL_TABLET | Freq: Every day | ORAL | 0 refills | Status: DC

## 2023-03-20 LAB — MICROALBUMIN / CREATININE URINE RATIO
Creatinine, Urine: 125 mg/dL (ref 20–275)
Microalb Creat Ratio: 8 mg/g{creat} (ref <30)
Microalb, Ur: 1 mg/dL

## 2023-03-22 ENCOUNTER — Ambulatory Visit
Admission: RE | Admit: 2023-03-22 | Discharge: 2023-03-22 | Disposition: A | Discharge status: Home / Self Care | Payer: PPO | Source: Ambulatory Visit | Attending: Family Medicine | Admitting: Family Medicine

## 2023-03-22 ENCOUNTER — Ambulatory Visit (INDEPENDENT_AMBULATORY_CARE_PROVIDER_SITE_OTHER): Payer: PPO

## 2023-03-22 ENCOUNTER — Ambulatory Visit: Payer: PPO

## 2023-03-22 DIAGNOSIS — Z23 Encounter for immunization: Secondary | ICD-10-CM | POA: Diagnosis not present

## 2023-03-22 DIAGNOSIS — R928 Other abnormal and inconclusive findings on diagnostic imaging of breast: Secondary | ICD-10-CM | POA: Diagnosis not present

## 2023-03-22 DIAGNOSIS — R92333 Mammographic heterogeneous density, bilateral breasts: Secondary | ICD-10-CM | POA: Diagnosis not present

## 2023-03-24 ENCOUNTER — Ambulatory Visit: Payer: PPO | Admitting: Family Medicine

## 2023-03-30 ENCOUNTER — Other Ambulatory Visit: Payer: Self-pay | Admitting: Family Medicine

## 2023-03-30 DIAGNOSIS — I10 Essential (primary) hypertension: Secondary | ICD-10-CM

## 2023-04-20 ENCOUNTER — Ambulatory Visit: Payer: PPO

## 2023-04-20 VITALS — BP 124/74

## 2023-04-20 DIAGNOSIS — I152 Hypertension secondary to endocrine disorders: Secondary | ICD-10-CM

## 2023-05-19 ENCOUNTER — Ambulatory Visit: Payer: PPO | Admitting: Family Medicine

## 2023-06-08 ENCOUNTER — Ambulatory Visit
Admission: RE | Admit: 2023-06-08 | Discharge: 2023-06-08 | Disposition: A | Discharge status: Home / Self Care | Payer: PPO | Source: Ambulatory Visit | Attending: Family Medicine | Admitting: Family Medicine

## 2023-06-08 DIAGNOSIS — E2839 Other primary ovarian failure: Secondary | ICD-10-CM | POA: Insufficient documentation

## 2023-06-08 DIAGNOSIS — Z780 Asymptomatic menopausal state: Secondary | ICD-10-CM | POA: Diagnosis not present

## 2023-06-23 ENCOUNTER — Other Ambulatory Visit: Payer: Self-pay | Admitting: Family Medicine

## 2023-06-23 DIAGNOSIS — I10 Essential (primary) hypertension: Secondary | ICD-10-CM

## 2023-06-24 ENCOUNTER — Other Ambulatory Visit: Payer: Self-pay | Admitting: Family Medicine

## 2023-06-24 DIAGNOSIS — I10 Essential (primary) hypertension: Secondary | ICD-10-CM

## 2023-06-24 NOTE — Telephone Encounter (Signed)
Pt has not been checking her BP at home, she has a medicare wellness on 2/20 and will let us know how her bp is that day. Pt has enough medicine till that appt.

## 2023-06-28 ENCOUNTER — Ambulatory Visit: Payer: PPO

## 2023-07-01 ENCOUNTER — Ambulatory Visit: Payer: PPO

## 2023-07-01 ENCOUNTER — Telehealth: Payer: Self-pay | Admitting: Family Medicine

## 2023-07-01 DIAGNOSIS — Z0000 Encounter for general adult medical examination without abnormal findings: Secondary | ICD-10-CM

## 2023-07-01 DIAGNOSIS — I10 Essential (primary) hypertension: Secondary | ICD-10-CM

## 2023-07-01 NOTE — Telephone Encounter (Signed)
 REQUESTING a few until she can get her BP checked next week   Copied from CRM #[id number]. Topic: Clinical - Medication Refill >> Jul 01, 2023 11:30 AM Phill Myron wrote: Most Recent Primary Care Visit:  Provider: Forde Radon  Department: [facility]  Visit Type: NURSE VISIT  Date: 04/20/2023  Medication: losartan (COZAAR) 25 MG tablet  Has the patient contacted their pharmacy? Yes (Agent: If no, request that the patient contact the pharmacy for the refill. If patient does not wish to contact the pharmacy document the reason why and proceed with request.) (Agent: If yes, when and what did the pharmacy advise?)  Is this the correct pharmacy for this prescription? Yes If no, delete pharmacy and type the correct one.  This is the patient's preferred pharmacy:  [id number] [place], [place] - [address] [place] [address] Phone: [phone] Fax: [phone]     Is the patient out of the medication? No   ..but patient is requesting a couple to last her until she can get her BP checked next week.   Has the patient been seen for an appointment in the last year OR does the patient have an upcoming appointment? Yes  Can we respond through MyChart? No  Agent: Please be advised that Rx refills may take up to 3 business days. We ask that you follow-up with your pharmacy.

## 2023-07-01 NOTE — Telephone Encounter (Signed)
 Copied from CRM [id number]. Topic: Appointments - Appointment Scheduling >> Jul 01, 2023 11:17 AM Phill Myron wrote: Madeline Pitts  Patient would like to reschedule her Feb 17 BP appt.  Would like it for monday if possible

## 2023-07-01 NOTE — Telephone Encounter (Signed)
 Pt will be completely out of her bp meds come Sunday. She has a nurse visit scheduled for Monday for the check but is asking that you refill the medication. Please send to  [facility]

## 2023-07-01 NOTE — Patient Instructions (Addendum)
 Madeline Pitts , Thank you for taking time to come for your Medicare Wellness Visit. I appreciate your ongoing commitment to your health goals. Please review the following plan we discussed and let me know if I can assist you in the future.   Referrals/Orders/Follow-Ups/Clinician Recommendations: NONE  This is a list of the screening recommended for you and due dates:  Health Maintenance  Topic Date Due   COVID-19 Vaccine (6 - 2024-25 season) 01/10/2023   Yearly kidney function blood test for diabetes  12/15/2023   Yearly kidney health urinalysis for diabetes  03/18/2024   Mammogram  03/21/2024   Medicare Annual Wellness Visit  06/30/2024   Colon Cancer Screening  03/11/2028   Pneumonia Vaccine  Completed   Flu Shot  Completed   DEXA scan (bone density measurement)  Completed   Hepatitis C Screening  Completed   Zoster (Shingles) Vaccine  Completed   HPV Vaccine  Aged Out   DTaP/Tdap/Td vaccine  Discontinued    Advanced directives: (ACP Link)Information on Advanced Care Planning can be found at [facility] Advance Health Care Directives Advance Health Care Directives ([email or web address]   Next Medicare Annual Wellness Visit scheduled for next year: Yes    07/06/24 @ 8:50 AM BY PHONE

## 2023-07-01 NOTE — Telephone Encounter (Signed)
 Please advice, pt aware PCP not in office today we will wait since she will be back tomorrow.

## 2023-07-01 NOTE — Progress Notes (Signed)
 Subjective:   Madeline Pitts is a 73 y.o. who presents for a Medicare Wellness preventive visit.  Visit Complete: Virtual I connected with  Madeline Pitts on 07/01/23 by a audio enabled telemedicine application and verified that I am speaking with the correct person using two identifiers.  Patient Location: Home  Provider Location: Office/Clinic  I discussed the limitations of evaluation and management by telemedicine. The patient expressed understanding and agreed to proceed.  Vital Signs: Because this visit was a virtual/telehealth visit, some criteria may be missing or patient reported. Any vitals not documented were not able to be obtained and vitals that have been documented are patient reported.  VideoDeclined- This patient declined Librarian, academic. Therefore the visit was completed with audio only.  AWV Questionnaire: No: Patient Medicare AWV questionnaire was not completed prior to this visit.  Cardiac Risk Factors include: advanced age (>48men, >24 women);dyslipidemia;hypertension;obesity (BMI >30kg/m2)     Objective:    There were no vitals filed for this visit. There is no height or weight on file to calculate BMI.     07/01/2023   11:01 AM 06/19/2022   12:09 PM 06/12/2021   11:39 AM 05/02/2020   10:27 AM 03/28/2019   11:54 AM 10/06/2017   10:04 AM 08/26/2016   11:30 AM  Advanced Directives  Does Patient Have a Medical Advance Directive? No No No No No No No  Would patient like information on creating a medical advance directive? No - Patient declined No - Patient declined No - Patient declined No - Patient declined Yes (MAU/Ambulatory/Procedural Areas - Information given) No - Patient declined     Current Medications (verified) Outpatient Encounter Medications as of 07/01/2023  Medication Sig   ammonium lactate (LAC-HYDRIN) 12 % lotion APPLY ONE APPLICATION TOPICALLY TO AFFECTED AREA(S) AS NEEDED FOR DRY SKIN    atorvastatin (LIPITOR) 20 MG tablet Take 1 tablet (20 mg total) by mouth daily.   Ginseng 250 MG CAPS Take by mouth.   GLUCOSAMINE SULFATE-MSM PO Take 2 tablets by mouth once.   hydrochlorothiazide (HYDRODIURIL) 12.5 MG tablet Take 1 tablet (12.5 mg total) by mouth daily.   Iron, Ferrous Sulfate, 325 (65 Fe) MG TABS Take 325 mg by mouth daily.   losartan (COZAAR) 25 MG tablet Take 1 tablet (25 mg total) by mouth daily.   Multiple Vitamin (MULTI-VITAMINS) TABS Take by mouth.   Omega-3 Fatty Acids (FISH OIL) 1000 MG CAPS Take 1 capsule by mouth 3 (three) times a week.   PREMPRO 0.3-1.5 MG tablet Take 1 tablet by mouth daily.   TURMERIC PO Take by mouth.   zinc gluconate 50 MG tablet Take 50 mg by mouth 2 (two) times a week.   No facility-administered encounter medications on file as of 07/01/2023.    Allergies (verified) Codeine and Penicillins   History: Past Medical History:  Diagnosis Date   Hyperlipidemia    Hypertension    Obesity    Prediabetes    Vitamin D deficiency    Past Surgical History:  Procedure Laterality Date   BREAST BIOPSY Right 09/26/2020   stereo biopsy/ coil clip/ benign   BREAST BIOPSY Right 09/26/2020   stereo biopsy/ coil clip/ benign   COLONOSCOPY  2009?   Dr Thurmond Butts   COLONOSCOPY WITH PROPOFOL N/A 10/06/2017   Procedure: COLONOSCOPY WITH PROPOFOL;  Surgeon: Earline Mayotte, MD;  Location: [facility] ENDOSCOPY;  Service: Endoscopy;  Laterality: N/A;   ENDOMETRIAL ABLATION     Family History  Problem Relation Age of Onset   Uterine cancer Mother    Gout Father    COPD Father    Emphysema Father        Was a heavy smoker   Lung cancer Brother        smoker   Lung cancer Brother    Breast cancer Neg Hx    Colon cancer Neg Hx    Colon polyps Neg Hx    Esophageal cancer Neg Hx    Rectal cancer Neg Hx    Stomach cancer Neg Hx    Social History   Socioeconomic History   Marital status: Married    Spouse name: Bernette Mayers    Number of children: 2    Years of education: Not on file   Highest education level: Bachelor's degree (e.g., BA, AB, BS)  Occupational History   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   Tobacco Use   Smoking status: Never   Smokeless tobacco: Never  Vaping Use   Vaping status: Never Used  Substance and Sexual Activity   Alcohol use: No    Alcohol/week: 0.0 standard drinks of alcohol   Drug use: No   Sexual activity: Yes    Partners: Male    Birth control/protection: Post-menopausal  Other Topics Concern   Not on file  Social History Narrative   Not on file   Social Drivers of Health   Financial Resource Strain: Low Risk  (07/01/2023)   Overall Financial Resource Strain (CARDIA)    Difficulty of Paying Living Expenses: Not hard at all  Food Insecurity: No Food Insecurity (07/01/2023)   Hunger Vital Sign    Worried About Running Out of Food in the Last Year: Never true    Ran Out of Food in the Last Year: Never true  Transportation Needs: No Transportation Needs (07/01/2023)   PRAPARE - Administrator, Civil Service (Medical): No    Lack of Transportation (Non-Medical): No  Physical Activity: Sufficiently Active (07/01/2023)   Exercise Vital Sign    Days of Exercise per Week: 5 days    Minutes of Exercise per Session: 50 min  Stress: No Stress Concern Present (07/01/2023)   [employer] of Occupational Health - Occupational Stress Questionnaire    Feeling of Stress : Not at all  Social Connections: Socially Integrated (07/01/2023)   Social Connection and Isolation Panel [NHANES]    Frequency of Communication with Friends and Family: More than three times a week    Frequency of Social Gatherings with Friends and Family: More than three times a week    Attends Religious Services: More than 4 times per year    Active Member of [employer] or Organizations: Yes    Attends Engineer, structural: More than 4 times per year    Marital Status: Married    Tobacco  Counseling Counseling given: Not Answered    Clinical Intake:  Pre-visit preparation completed: Yes  Pain : No/denies pain     BMI - recorded: 34.4 Nutritional Status: BMI > 30  Obese Nutritional Risks: None Diabetes: No  How often do you need to have someone help you when you read instructions, pamphlets, or other written materials from your doctor or pharmacy?: 1 - Never  Interpreter Needed?: No  Information entered by :: Kennedy Bucker, LPN   Activities of Daily Living     07/01/2023   11:02 AM 03/19/2023   11:44 AM  In your present state of health, do you have  any difficulty performing the following activities:  Hearing? 0 0  Vision? 0 0  Difficulty concentrating or making decisions? 0 0  Walking or climbing stairs? 0 0  Dressing or bathing? 0 0  Doing errands, shopping? 0 0  Preparing Food and eating ? N   Using the Toilet? N   In the past six months, have you accidently leaked urine? N   Do you have problems with loss of bowel control? N   Managing your Medications? N   Managing your Finances? N   Housekeeping or managing your Housekeeping? N     Patient Care Team: Alba Cory, MD as PCP - General (Family Medicine) Mackey Birchwood, MD as Referring Physician (Obstetrics and Gynecology)  Indicate any recent Medical Services you may have received from other than [facility] providers in the past year (date may be approximate).     Assessment:   This is a routine wellness examination for Madeline Pitts.  Hearing/Vision screen Hearing Screening - Comments:: NO AIDS Vision Screening - Comments:: READERS- MY EYE DOCTOR IN [place]   Goals Addressed             This Visit's Progress    DIET - EAT MORE FRUITS AND VEGETABLES         Depression Screen     07/01/2023   10:59 AM 03/19/2023   11:44 AM 12/15/2022   11:18 AM 06/19/2022   11:46 AM 03/17/2022   11:06 AM 12/09/2021   11:35 AM 09/23/2021   10:46 AM  PHQ 2/9 Scores  PHQ - 2 Score 0 0 0 0 0 0 0  PHQ- 9 Score 0  0 0  0 0     Fall Risk     07/01/2023   11:01 AM 03/19/2023   11:44 AM 12/15/2022   11:20 AM 06/19/2022   11:39 AM 03/17/2022   11:06 AM  Fall Risk   Falls in the past year? 1 0 0 0 0  Number falls in past yr: 0 0  0 0  Injury with Fall? 0 0  0 0  Risk for fall due to :  No Fall Risks No Fall Risks No Fall Risks No Fall Risks  Follow up Falls prevention discussed;Falls evaluation completed Falls prevention discussed Falls prevention discussed;Education provided;Falls evaluation completed Education provided;Falls prevention discussed Falls prevention discussed    MEDICARE RISK AT HOME:  Medicare Risk at Home Any stairs in or around the home?: Yes If so, are there any without handrails?: No Home free of loose throw rugs in walkways, pet beds, electrical cords, etc?: Yes Adequate lighting in your home to reduce risk of falls?: Yes Life alert?: No Use of a cane, walker or w/c?: No Grab bars in the bathroom?: Yes Shower chair or bench in shower?: No Elevated toilet seat or a handicapped toilet?: Yes  TIMED UP AND GO:  Was the test performed?  No  Cognitive Function: 6CIT completed        07/01/2023   11:04 AM 06/19/2022   11:58 AM 05/02/2020   10:30 AM  6CIT Screen  What Year? 0 points 0 points 0 points  What month? 0 points 0 points 0 points  What time? 0 points 0 points 0 points  Count back from 20 0 points 0 points 0 points  Months in reverse 0 points 0 points 0 points  Repeat phrase 0 points 0 points 0 points  Total Score 0 points 0 points 0 points    Immunizations Immunization History  Administered Date(s) Administered   Fluad Quad(high Dose 65+) 03/28/2019, 05/02/2020, 03/17/2022   Fluad Trivalent(High Dose 65+) 03/22/2023   Influenza, High Dose Seasonal PF 01/17/2021   Moderna Covid-19 Vaccine Bivalent Booster 98yrs & up 04/01/2021   Moderna Sars-Covid-2 Vaccination 06/09/2019, 07/07/2019, 03/15/2020, 12/21/2020   PNEUMOCOCCAL CONJUGATE-20 12/03/2020    Pneumococcal Conjugate-13 12/19/2018   Tdap 03/14/2012   Zoster Recombinant(Shingrix) 10/27/2022, 04/13/2023    Screening Tests Health Maintenance  Topic Date Due   COVID-19 Vaccine (6 - 2024-25 season) 01/10/2023   Diabetic kidney evaluation - eGFR measurement  12/15/2023   Diabetic kidney evaluation - Urine ACR  03/18/2024   MAMMOGRAM  03/21/2024   Medicare Annual Wellness (AWV)  06/30/2024   Colonoscopy  03/11/2028   Pneumonia Vaccine 66+ Years old  Completed   INFLUENZA VACCINE  Completed   DEXA SCAN  Completed   Hepatitis C Screening  Completed   Zoster Vaccines- Shingrix  Completed   HPV VACCINES  Aged Out   DTaP/Tdap/Td  Discontinued    Health Maintenance  Health Maintenance Due  Topic Date Due   COVID-19 Vaccine (6 - 2024-25 season) 01/10/2023   Health Maintenance Items Addressed: UP TO DATE  Additional Screening:  Vision Screening: Recommended annual ophthalmology exams for early detection of glaucoma and other disorders of the eye.  Dental Screening: Recommended annual dental exams for proper oral hygiene  Community Resource Referral / Chronic Care Management: CRR required this visit?  No   CCM required this visit?  No     Plan:     I have personally reviewed and noted the following in the patient's chart:   Medical and social history Use of alcohol, tobacco or illicit drugs  Current medications and supplements including opioid prescriptions. Patient is not currently taking opioid prescriptions. Functional ability and status Nutritional status Physical activity Advanced directives List of other physicians Hospitalizations, surgeries, and ER visits in previous 12 months Vitals Screenings to include cognitive, depression, and falls Referrals and appointments  In addition, I have reviewed and discussed with patient certain preventive protocols, quality metrics, and best practice recommendations. A written personalized care plan for preventive  services as well as general preventive health recommendations were provided to patient.     Hal Hope, LPN   1/61/0960   After Visit Summary: (MyChart) Due to this being a telephonic visit, the after visit summary with patients personalized plan was offered to patient via MyChart   Notes: Nothing significant to report at this time.

## 2023-07-02 ENCOUNTER — Ambulatory Visit: Payer: PPO | Admitting: Family Medicine

## 2023-07-02 ENCOUNTER — Encounter: Payer: Self-pay | Admitting: Family Medicine

## 2023-07-02 VITALS — BP 128/76 | HR 83 | Resp 16 | Ht 62.0 in | Wt 192.0 lb

## 2023-07-02 DIAGNOSIS — I152 Hypertension secondary to endocrine disorders: Secondary | ICD-10-CM | POA: Diagnosis not present

## 2023-07-02 DIAGNOSIS — E1169 Type 2 diabetes mellitus with other specified complication: Secondary | ICD-10-CM | POA: Diagnosis not present

## 2023-07-02 DIAGNOSIS — L853 Xerosis cutis: Secondary | ICD-10-CM

## 2023-07-02 DIAGNOSIS — I10 Essential (primary) hypertension: Secondary | ICD-10-CM

## 2023-07-02 DIAGNOSIS — E1159 Type 2 diabetes mellitus with other circulatory complications: Secondary | ICD-10-CM | POA: Diagnosis not present

## 2023-07-02 DIAGNOSIS — Z8639 Personal history of other endocrine, nutritional and metabolic disease: Secondary | ICD-10-CM

## 2023-07-02 DIAGNOSIS — D708 Other neutropenia: Secondary | ICD-10-CM

## 2023-07-02 DIAGNOSIS — E785 Hyperlipidemia, unspecified: Secondary | ICD-10-CM

## 2023-07-02 DIAGNOSIS — Z6835 Body mass index (BMI) 35.0-35.9, adult: Secondary | ICD-10-CM | POA: Diagnosis not present

## 2023-07-02 DIAGNOSIS — E669 Obesity, unspecified: Secondary | ICD-10-CM

## 2023-07-02 LAB — POCT GLYCOSYLATED HEMOGLOBIN (HGB A1C): Hemoglobin A1C: 5.8 % — AB (ref 4.0–5.6)

## 2023-07-02 MED ORDER — AMMONIUM LACTATE 12 % EX LOTN: TOPICAL_LOTION | CUTANEOUS | 1 refills | Status: DC | PRN

## 2023-07-02 MED ORDER — ATORVASTATIN CALCIUM 20 MG PO TABS: 20.0000 mg | ORAL_TABLET | Freq: Every day | ORAL | 1 refills | Status: DC

## 2023-07-02 MED ORDER — HYDROCHLOROTHIAZIDE 12.5 MG PO TABS: 12.5000 mg | ORAL_TABLET | Freq: Every day | ORAL | 1 refills | Status: DC

## 2023-07-02 MED ORDER — LOSARTAN POTASSIUM 25 MG PO TABS: 25.0000 mg | ORAL_TABLET | Freq: Every day | ORAL | 1 refills | Status: DC

## 2023-07-02 NOTE — Telephone Encounter (Signed)
 Spoke with pt and she will come today at 11:40

## 2023-07-02 NOTE — Progress Notes (Signed)
 Name: Madeline Pitts   MRN: [id number]    DOB: February 07, 1951   Date:07/02/2023       Progress Note  Subjective  Chief Complaint  Chief Complaint  Patient presents with   Medical Management of Chronic Issues   HPI   Diabetes Type 2 :  she had two levels of A1C over 6.5 % and has diet controlled diabetes associated with obesity,   HTN and dyslipidemia. She denies polyphagia, polydipsia or polyuria She has implemented life style modifications such as cutting down on sweet tea- from large sweet daily to once a week and drinking a flavored water. She was able to bring down her her A1C today is down to 5.8 %   HTN: she has been taking medication as prescribed, and denies side effects. No chest pain, dizziness  or palpitation. BP was towards the low end of normal so we changed from losartan hydrochlorothiazide 50/12.5 , to losartan 25 mg and hydrochlorothiazide at 12.5 mg, BP today is at goal    Leucopenia: stable over time, between 3.2 and 3.5 over the past year , we will continue to monitor   Morbid Obesity:  BMI is above 35 again  with co-morbidities such as DM, HTN and dyslipidemia   Vitamin D deficiency: taking otc supplementation , currently her MVI has 2000 units of vitamin D, last level was at goal    Hyperlipidemia: she started Atorvastatin  early 2024 and LDL is at goal 67    History Iron deficiency anemia: it happened during COVID-19 Summer 2022 , she has been taking iron supplementation and last level improved, colonoscopy up to date . She still take iron pills a few times a week Last ferritin was stable at 21    Enlarge thyroid : under the care of Dr. Andee Poles, she denies dysphagia.     Patient Active Problem List   Diagnosis Date Noted   Dyslipidemia associated with type 2 diabetes mellitus (HCC) 07/02/2023   Obesity (BMI 30.0-34.9) 03/19/2023   Thyroid enlargement 09/23/2021   Enlarged parotid gland 09/23/2021   Iron deficiency 09/23/2021   Hyperglycemia 09/23/2021    Other neutropenia (HCC) 06/24/2021   Pre-diabetes 08/26/2016   Dyslipidemia 08/26/2016   Fibroid 04/25/2015   Hypertension associated with type 2 diabetes mellitus (HCC) 04/25/2015   Menopausal symptom 09/08/2012    Past Surgical History:  Procedure Laterality Date   BREAST BIOPSY Right 09/26/2020   stereo biopsy/ coil clip/ benign   BREAST BIOPSY Right 09/26/2020   stereo biopsy/ coil clip/ benign   COLONOSCOPY  2009?   Dr Thurmond Butts   COLONOSCOPY WITH PROPOFOL N/A 10/06/2017   Procedure: COLONOSCOPY WITH PROPOFOL;  Surgeon: Earline Mayotte, MD;  [facility] END[facility]COPY;  Service: Endoscopy;  Laterality: N/A;   ENDOMETRIAL ABLATION      Family History  Problem Relation Age of Onset   Uterine cancer Mother    Gout Father    COPD Father    Emphysema Father        Was a heavy smoker   Lung cancer Brother        smoker   Lung cancer Brother    Breast cancer Neg Hx    Colon cancer Neg Hx    Colon polyps Neg Hx    Esophageal cancer Neg Hx    Rectal cancer Neg Hx    Stomach cancer Neg Hx     Social History   Tobacco Use   Smoking status: Never   Smokeless tobacco: Never  Substance Use Topics   Alcohol use: No    Alcohol/week: 0.0 standard drinks of alcohol     Current Outpatient Medications:    Ginseng 250 MG CAPS, Take by mouth., Disp: , Rfl:    GLUCOSAMINE SULFATE-MSM PO, Take 2 tablets by mouth once., Disp: , Rfl:    Iron, Ferrous Sulfate, 325 (65 Fe) MG TABS, Take 325 mg by mouth daily., Disp: 30 tablet, Rfl: 5   Multiple Vitamin (MULTI-VITAMINS) TABS, Take by mouth., Disp: , Rfl:    Omega-3 Fatty Acids (FISH OIL) 1000 MG CAPS, Take 1 capsule by mouth 3 (three) times a week., Disp: , Rfl:    PREMPRO 0.3-1.5 MG tablet, Take 1 tablet by mouth daily., Disp: , Rfl:    TURMERIC PO, Take by mouth., Disp: , Rfl:    zinc gluconate 50 MG tablet, Take 50 mg by mouth 2 (two) times a week., Disp: , Rfl:    ammonium lactate (LAC-HYDRIN) 12 % lotion, Apply topically as needed  for dry skin., Disp: 400 mL, Rfl: 1   atorvastatin (LIPITOR) 20 MG tablet, Take 1 tablet (20 mg total) by mouth daily., Disp: 90 tablet, Rfl: 1   hydrochlorothiazide (HYDRODIURIL) 12.5 MG tablet, Take 1 tablet (12.5 mg total) by mouth daily., Disp: 90 tablet, Rfl: 1   losartan (COZAAR) 25 MG tablet, Take 1 tablet (25 mg total) by mouth daily., Disp: 90 tablet, Rfl: 1  Allergies  Allergen Reactions   Codeine Nausea Only and Other (See Comments)    Makes patient feel lethargic-can't move   Penicillins Nausea Only    I personally reviewed active problem list, medication list, allergies, family history with the patient/caregiver today.   ROS  Ten systems reviewed and is negative except as mentioned in HPI    Objective  Vitals:   07/02/23 1150  BP: 128/76  Pulse: 83  Resp: 16  SpO2: 96%  Weight: 192 lb (87.1 kg)  Height: 5\' 2"  (1.575 m)    Body mass index is 35.12 kg/m.  Physical Exam  Constitutional: Patient appears well-developed and well-nourished. Obese  No distress.  HEENT: head atraumatic, normocephalic, pupils equal and reactive to light, neck supple Cardiovascular: Normal rate, regular rhythm and normal heart sounds.  No murmur heard. No BLE edema. Pulmonary/Chest: Effort normal and breath sounds normal. No respiratory distress. Abdominal: Soft.  There is no tenderness. Psychiatric: Patient has a normal mood and affect. behavior is normal. Judgment and thought content normal.    Diabetic Foot Exam:  Diabetic foot exam was performed with the following findings:   No deformities, ulcerations, or other skin breakdown Normal sensation of 10g monofilament Intact posterior tibialis and dorsalis pedis pulses      PHQ2/9:    07/01/2023   10:59 AM 03/19/2023   11:44 AM 12/15/2022   11:18 AM 06/19/2022   11:46 AM 03/17/2022   11:06 AM  Depression screen PHQ 2/9  Decreased Interest 0 0 0 0 0  Down, Depressed, Hopeless 0 0 0 0 0  PHQ - 2 Score 0 0 0 0 0  Altered  sleeping 0 0 0  0  Tired, decreased energy 0 0 0  0  Change in appetite 0 0 0  0  Feeling bad or failure about yourself  0 0 0  0  Trouble concentrating 0 0 0  0  Moving slowly or fidgety/restless 0 0 0  0  Suicidal thoughts 0 0 0  0  PHQ-9 Score 0 0 0  0  Difficult doing work/chores Not difficult at all        phq 9 is negative  Fall Risk:    07/01/2023   11:01 AM 03/19/2023   11:44 AM 12/15/2022   11:20 AM 06/19/2022   11:39 AM 03/17/2022   11:06 AM  Fall Risk   Falls in the past year? 1 0 0 0 0  Number falls in past yr: 0 0  0 0  Injury with Fall? 0 0  0 0  Risk for fall due to :  No Fall Risks No Fall Risks No Fall Risks No Fall Risks  Follow up Falls prevention discussed;Falls evaluation completed Falls prevention discussed Falls prevention discussed;Education provided;Falls evaluation completed Education provided;Falls prevention discussed Falls prevention discussed     Assessment & Plan  1. Obesity, diabetes, and hypertension syndrome (HCC) (Primary)  Discussed life style modifications  2. Dyslipidemia associated with type 2 diabetes mellitus (HCC)  - POCT HgB A1C - HM Diabetes Foot Exam  3. Morbid obesity (HCC)  Discussed with the patient the risk posed by an increased BMI. Discussed importance of portion control, calorie counting and at least 150 minutes of physical activity weekly. Avoid sweet beverages and drink more water. Eat at least 6 servings of fruit and vegetables daily    4. Other neutropenia (HCC)  Recheck next visit   5. History of iron deficiency  Continue ferrous sulfate  6. Essential hypertension  - hydrochlorothiazide (HYDRODIURIL) 12.5 MG tablet; Take 1 tablet (12.5 mg total) by mouth daily.  Dispense: 90 tablet; Refill: 1 - losartan (COZAAR) 25 MG tablet; Take 1 tablet (25 mg total) by mouth daily.  Dispense: 90 tablet; Refill: 1  7. Dyslipidemia  - atorvastatin (LIPITOR) 20 MG tablet; Take 1 tablet (20 mg total) by mouth daily.   Dispense: 90 tablet; Refill: 1  8. Dry skin  - ammonium lactate (LAC-HYDRIN) 12 % lotion; Apply topically as needed for dry skin.  Dispense: 400 mL; Refill: 1

## 2023-07-05 ENCOUNTER — Ambulatory Visit: Payer: PPO

## 2023-08-09 ENCOUNTER — Other Ambulatory Visit: Payer: Self-pay | Admitting: Family Medicine

## 2023-08-09 DIAGNOSIS — L853 Xerosis cutis: Secondary | ICD-10-CM

## 2023-08-09 NOTE — Telephone Encounter (Signed)
 Copied from CRM [id number]. Topic: Clinical - Medication Refill >> Aug 09, 2023  1:06 PM DeAngela L wrote: Most Recent Primary Care Visit:  Provider: Alba Cory  Department: [facility]  Visit Type: OFFICE VISIT  Date: 07/02/2023  Medication: ammonium lactate (LAC-HYDRIN) 12 % lotion   Has the patient contacted their pharmacy? No (Agent: If no, request that the patient contact the pharmacy for the refill. If patient does not wish to contact the pharmacy document the reason why and proceed with request.) (Agent: If yes, when and what did the pharmacy advise?)  Is this the correct pharmacy for this prescription? Yes  If no, delete pharmacy and type the correct one.  This is the patient's preferred pharmacy:  [facility]  [address] [place], [place], [place] [address] Phone [phone]   Has the prescription been filled recently? No   Is the patient out of the medication? Yes   Has the patient been seen for an appointment in the last year OR does the patient have an upcoming appointment? Yes   Can we respond through MyChart? No  Agent: Please be advised that Rx refills may take up to 3 business days. We ask that you follow-up with your pharmacy.

## 2023-08-11 MED ORDER — AMMONIUM LACTATE 12 % EX LOTN: TOPICAL_LOTION | CUTANEOUS | 0 refills | Status: DC | PRN

## 2023-08-11 NOTE — Telephone Encounter (Signed)
 Called pt to make sure of correct pharmacy. Request was for a different pharmcy than what was on the order. Verified it Was [facility].  Requested Prescriptions  Pending Prescriptions Disp Refills   ammonium lactate (LAC-HYDRIN) 12 % lotion 400 mL 1    Sig: Apply topically as needed for dry skin.     Dermatology:  Other Passed - 08/11/2023 12:54 PM      Passed - Valid encounter within last 12 months    Recent Outpatient Visits           1 month ago Obesity, diabetes, and hypertension syndrome [facility])   [facility] Alba Cory, MD       Future Appointments             In 4 months Carlynn Purl, Danna Hefty, MD [facility], [facility]

## 2023-12-21 ENCOUNTER — Encounter: Payer: Self-pay | Admitting: Family Medicine

## 2023-12-21 ENCOUNTER — Ambulatory Visit: Payer: Self-pay | Admitting: Family Medicine

## 2023-12-21 ENCOUNTER — Other Ambulatory Visit: Payer: Self-pay | Admitting: Family Medicine

## 2023-12-21 VITALS — BP 130/74 | HR 62 | Resp 16 | Ht 61.13 in | Wt 198.8 lb

## 2023-12-21 DIAGNOSIS — I152 Hypertension secondary to endocrine disorders: Secondary | ICD-10-CM | POA: Diagnosis not present

## 2023-12-21 DIAGNOSIS — Z0001 Encounter for general adult medical examination with abnormal findings: Secondary | ICD-10-CM

## 2023-12-21 DIAGNOSIS — E1159 Type 2 diabetes mellitus with other circulatory complications: Secondary | ICD-10-CM

## 2023-12-21 DIAGNOSIS — E1169 Type 2 diabetes mellitus with other specified complication: Secondary | ICD-10-CM | POA: Diagnosis not present

## 2023-12-21 DIAGNOSIS — D708 Other neutropenia: Secondary | ICD-10-CM | POA: Diagnosis not present

## 2023-12-21 DIAGNOSIS — E785 Hyperlipidemia, unspecified: Secondary | ICD-10-CM | POA: Diagnosis not present

## 2023-12-21 DIAGNOSIS — Z0000 Encounter for general adult medical examination without abnormal findings: Secondary | ICD-10-CM

## 2023-12-21 DIAGNOSIS — E669 Obesity, unspecified: Secondary | ICD-10-CM | POA: Insufficient documentation

## 2023-12-21 DIAGNOSIS — N6489 Other specified disorders of breast: Secondary | ICD-10-CM

## 2023-12-21 DIAGNOSIS — Z1231 Encounter for screening mammogram for malignant neoplasm of breast: Secondary | ICD-10-CM

## 2023-12-21 DIAGNOSIS — R928 Other abnormal and inconclusive findings on diagnostic imaging of breast: Secondary | ICD-10-CM

## 2023-12-21 DIAGNOSIS — E559 Vitamin D deficiency, unspecified: Secondary | ICD-10-CM | POA: Diagnosis not present

## 2023-12-21 MED ORDER — HYDROCHLOROTHIAZIDE 12.5 MG PO TABS: 12.5000 mg | ORAL_TABLET | Freq: Every day | ORAL | 1 refills | Status: DC

## 2023-12-21 MED ORDER — ATORVASTATIN CALCIUM 20 MG PO TABS: 20.0000 mg | ORAL_TABLET | Freq: Every day | ORAL | 1 refills | Status: AC

## 2023-12-21 MED ORDER — LOSARTAN POTASSIUM 25 MG PO TABS: 25.0000 mg | ORAL_TABLET | Freq: Every day | ORAL | 1 refills | Status: DC

## 2023-12-21 NOTE — Patient Instructions (Signed)
 Preventive Care 83 Years and Older, Female Preventive care refers to lifestyle choices and visits with your health care provider that can promote health and wellness. Preventive care visits are also called wellness exams. What can I expect for my preventive care visit? Counseling Your health care provider may ask you questions about your: Medical history, including: Past medical problems. Family medical history. Pregnancy and menstrual history. History of falls. Current health, including: Memory and ability to understand (cognition). Emotional well-being. Home life and relationship well-being. Sexual activity and sexual health. Lifestyle, including: Alcohol, nicotine or tobacco, and drug use. Access to firearms. Diet, exercise, and sleep habits. Work and work Astronomer. Sunscreen use. Safety issues such as seatbelt and bike helmet use. Physical exam Your health care provider will check your: Height and weight. These may be used to calculate your BMI (body mass index). BMI is a measurement that tells if you are at a healthy weight. Waist circumference. This measures the distance around your waistline. This measurement also tells if you are at a healthy weight and may help predict your risk of certain diseases, such as type 2 diabetes and high blood pressure. Heart rate and blood pressure. Body temperature. Skin for abnormal spots. What immunizations do I need?  Vaccines are usually given at various ages, according to a schedule. Your health care provider will recommend vaccines for you based on your age, medical history, and lifestyle or other factors, such as travel or where you work. What tests do I need? Screening Your health care provider may recommend screening tests for certain conditions. This may include: Lipid and cholesterol levels. Hepatitis C test. Hepatitis B test. HIV (human immunodeficiency virus) test. STI (sexually transmitted infection) testing, if you are at  risk. Lung cancer screening. Colorectal cancer screening. Diabetes screening. This is done by checking your blood sugar (glucose) after you have not eaten for a while (fasting). Mammogram. Talk with your health care provider about how often you should have regular mammograms. BRCA-related cancer screening. This may be done if you have a family history of breast, ovarian, tubal, or peritoneal cancers. Bone density scan. This is done to screen for osteoporosis. Talk with your health care provider about your test results, treatment options, and if necessary, the need for more tests. Follow these instructions at home: Eating and drinking  Eat a diet that includes fresh fruits and vegetables, whole grains, lean protein, and low-fat dairy products. Limit your intake of foods with high amounts of sugar, saturated fats, and salt. Take vitamin and mineral supplements as recommended by your health care provider. Do not drink alcohol if your health care provider tells you not to drink. If you drink alcohol: Limit how much you have to 0-1 drink a day. Know how much alcohol is in your drink. In the [place] one drink equals one 12 oz bottle of beer (355 mL), one 5 oz glass of wine (148 mL), or one 1 oz glass of hard liquor (44 mL). Lifestyle Brush your teeth every morning and night with fluoride toothpaste. Floss one time each day. Exercise for at least 30 minutes 5 or more days each week. Do not use any products that contain nicotine or tobacco. These products include cigarettes, chewing tobacco, and vaping devices, such as e-cigarettes. If you need help quitting, ask your health care provider. Do not use drugs. If you are sexually active, practice safe sex. Use a condom or other form of protection in order to prevent STIs. Take aspirin only as told by  your health care provider. Make sure that you understand how much to take and what form to take. Work with your health care provider to find out whether it  is safe and beneficial for you to take aspirin daily. Ask your health care provider if you need to take a cholesterol-lowering medicine (statin). Find healthy ways to manage stress, such as: Meditation, yoga, or listening to music. Journaling. Talking to a trusted person. Spending time with friends and family. Minimize exposure to UV radiation to reduce your risk of skin cancer. Safety Always wear your seat belt while driving or riding in a vehicle. Do not drive: If you have been drinking alcohol. Do not ride with someone who has been drinking. When you are tired or distracted. While texting. If you have been using any mind-altering substances or drugs. Wear a helmet and other protective equipment during sports activities. If you have firearms in your house, make sure you follow all gun safety procedures. What's next? Visit your health care provider once a year for an annual wellness visit. Ask your health care provider how often you should have your eyes and teeth checked. Stay up to date on all vaccines. This information is not intended to replace advice given to you by your health care provider. Make sure you discuss any questions you have with your health care provider. Document Revised: 10/23/2020 Document Reviewed: 10/23/2020 Elsevier Patient Education  2024 [employer].

## 2023-12-21 NOTE — Progress Notes (Signed)
 Name: Madeline Pitts   MRN: [id number]    DOB: 1950/12/17   Date:12/21/2023       Progress Note  Subjective  Chief Complaint  Chief Complaint  Patient presents with   Annual Exam    HPI  Patient presents for annual CPE and follow up  Discussed the use of AI scribe software for clinical note transcription with the patient, who gave verbal consent to proceed.  History of Present Illness Madeline Pitts is a 73 year old female who presents for a routine follow-up.  She has type 2 diabetes diagnosed last year after two A1c levels over 6.5%. She manages her diabetes with dietary changes, specifically by reducing carbohydrate intake. Her A1c improved from 6.5% to 5.8% as of February. No excessive thirst, hunger, or increased urination. She has gained approximately 6.8 pounds since her last visit. She has not been exercising regularly for the past three to four months and plans to resume regular physical activity and mindful eating habits.  She has hypertension and is currently taking losartan 25 mg and hydrochlorothiazide (HCTZ) 12.5 mg daily. No chest pain, palpitations, or leg swelling. She does not monitor her blood pressure at home.  For dyslipidemia, she is on atorvastatin 20 mg daily and reports no side effects such as muscle aches.  She has a history of neutropenia with a stable low white blood cell count ranging from 3.2 to 3.5 since 2023. She also has a history of iron deficiency anemia, but her iron levels were normal at the last check. She takes an iron supplement three times a week. She is not  craving non-food items or having shortness of breath.  She has a history of thyroid enlargement and is monitored by an ENT. An ultrasound is scheduled for September 23 to assess the thyroid.  She takes a daily vitamin D supplement as part of her routine.    Diet: cooks at home, but during the Summer eats out a few times a week. She cuts the meals in portion. Not fast food.  She eats salads/vegetables and fruit daily  Exercise:  she has not been physically active lately. She states she will resume her routine of 3 times a week for at least 45 minutes  Last Eye Exam: encouraged to complete Last Dental Exam: completed  Flowsheet Row Clinical Support from 07/01/2023 in [facility]  AUDIT-C Score 0   Depression: Phq 9 is  negative    12/21/2023   11:12 AM 07/01/2023   10:59 AM 03/19/2023   11:44 AM 12/15/2022   11:18 AM 06/19/2022   11:46 AM  Depression screen PHQ 2/9  Decreased Interest 0 0 0 0 0  Down, Depressed, Hopeless 0 0 0 0 0  PHQ - 2 Score 0 0 0 0 0  Altered sleeping  0 0 0   Tired, decreased energy  0 0 0   Change in appetite  0 0 0   Feeling bad or failure about yourself   0 0 0   Trouble concentrating  0 0 0   Moving slowly or fidgety/restless  0 0 0   Suicidal thoughts  0 0 0   PHQ-9 Score  0 0 0   Difficult doing work/chores  Not difficult at all      Hypertension: BP Readings from Last 3 Encounters:  12/21/23 130/74  07/02/23 128/76  04/20/23 124/74   Obesity: Wt Readings from Last 3 Encounters:  12/21/23 198 lb 12.8 oz (90.2  kg)  07/02/23 192 lb (87.1 kg)  03/19/23 188 lb (85.3 kg)   BMI Readings from Last 3 Encounters:  12/21/23 37.41 kg/m  07/02/23 35.12 kg/m  03/19/23 34.39 kg/m     Vaccines: reviewed with the patient.   Hep C Screening: completed STD testing and prevention (HIV/chl/gon/syphilis): N/A Intimate partner violence: negative screen  Sexual History : one partner, she uses lubrication Menstrual History/LMP/Abnormal Bleeding: menopause  Discussed importance of follow up if any post-menopausal bleeding: yes  Incontinence Symptoms: negative for symptoms   Breast cancer:  - Last Mammogram: due for repeat in Nov 2024 - BRCA gene screening: N/A  Osteoporosis Prevention : Discussed high calcium  and vitamin D  supplementation, weight bearing exercises Bone density :up to date     Cervical cancer screening: not applicable due to age  Skin cancer: Discussed monitoring for atypical lesions  Colorectal cancer: up to date    Lung cancer:  Low Dose CT Chest recommended if Age 38-80 years, 20 pack-year currently smoking OR have quit w/in 15years. Patient does not qualify for screen   ECG: 2022  Advanced Care Planning: A voluntary discussion about advance care planning including the explanation and discussion of advance directives.  Discussed health care proxy and Living will, and the patient was able to identify a health care proxy as husband.  Patient does not have a living will and power of attorney of health care   Patient Active Problem List   Diagnosis Date Noted   Dyslipidemia associated with type 2 diabetes mellitus (HCC) 07/02/2023   Obesity (BMI 30.0-34.9) 03/19/2023   Thyroid  enlargement 09/23/2021   Enlarged parotid gland 09/23/2021   Iron  deficiency 09/23/2021   Hyperglycemia 09/23/2021   Other neutropenia (HCC) 06/24/2021   Pre-diabetes 08/26/2016   Dyslipidemia 08/26/2016   Fibroid 04/25/2015   Hypertension associated with type 2 diabetes mellitus (HCC) 04/25/2015   Menopausal symptom 09/08/2012    Past Surgical History:  Procedure Laterality Date   BREAST BIOPSY Right 09/26/2020   stereo biopsy/ coil clip/ benign   BREAST BIOPSY Right 09/26/2020   stereo biopsy/ coil clip/ benign   COLONOSCOPY  2009?   Dr Desiderio   COLONOSCOPY WITH PROPOFOL  N/A 10/06/2017   Procedure: COLONOSCOPY WITH PROPOFOL ;  Surgeon: Dessa Reyes ORN, MD;  Location: [facility] ENDOSCOPY;  Service: Endoscopy;  Laterality: N/A;   ENDOMETRIAL ABLATION      Family History  Problem Relation Age of Onset   Uterine cancer Mother    Gout Father    COPD Father    Emphysema Father        Was a heavy smoker   Lung cancer Brother        smoker   Lung cancer Brother    Breast cancer Neg Hx    Colon cancer Neg Hx    Colon polyps Neg Hx    Esophageal cancer Neg Hx    Rectal  cancer Neg Hx    Stomach cancer Neg Hx     Social History   Socioeconomic History   Marital status: Married    Spouse name: Roselyn    Number of children: 2   Years of education: Not on file   Highest education level: Bachelor's degree (e.g., BA, AB, BS)  Occupational History   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   Tobacco Use   Smoking status: Never   Smokeless tobacco: Never  Vaping Use   Vaping status: Never Used  Substance and Sexual Activity   Alcohol use: No  Alcohol/week: 0.0 standard drinks of alcohol   Drug use: No   Sexual activity: Yes    Partners: Male    Birth control/protection: Post-menopausal  Other Topics Concern   Not on file  Social History Narrative   Not on file   Social Drivers of Health   Financial Resource Strain: Low Risk  (07/01/2023)   Overall Financial Resource Strain (CARDIA)    Difficulty of Paying Living Expenses: Not hard at all  Food Insecurity: No Food Insecurity (07/01/2023)   Hunger Vital Sign    Worried About Running Out of Food in the Last Year: Never true    Ran Out of Food in the Last Year: Never true  Transportation Needs: No Transportation Needs (07/01/2023)   PRAPARE - Administrator, Civil Service (Medical): No    Lack of Transportation (Non-Medical): No  Physical Activity: Sufficiently Active (07/01/2023)   Exercise Vital Sign    Days of Exercise per Week: 5 days    Minutes of Exercise per Session: 50 min  Stress: No Stress Concern Present (07/01/2023)   [employer] of Occupational Health - Occupational Stress Questionnaire    Feeling of Stress : Not at all  Social Connections: Socially Integrated (07/01/2023)   Social Connection and Isolation Panel    Frequency of Communication with Friends and Family: More than three times a week    Frequency of Social Gatherings with Friends and Family: More than three times a week    Attends Religious Services: More than 4 times per year    Active Member of  [employer] or Organizations: Yes    Attends Engineer, structural: More than 4 times per year    Marital Status: Married  Catering manager Violence: Not At Risk (07/01/2023)   Humiliation, Afraid, Rape, and Kick questionnaire    Fear of Current or Ex-Partner: No    Emotionally Abused: No    Physically Abused: No    Sexually Abused: No     Current Outpatient Medications:    ammonium lactate  (LAC-HYDRIN ) 12 % lotion, Apply topically as needed for dry skin., Disp: 400 mL, Rfl: 0   atorvastatin  (LIPITOR) 20 MG tablet, Take 1 tablet (20 mg total) by mouth daily., Disp: 90 tablet, Rfl: 1   Ginseng 250 MG CAPS, Take by mouth., Disp: , Rfl:    GLUCOSAMINE SULFATE-MSM PO, Take 2 tablets by mouth once., Disp: , Rfl:    hydrochlorothiazide  (HYDRODIURIL ) 12.5 MG tablet, Take 1 tablet (12.5 mg total) by mouth daily., Disp: 90 tablet, Rfl: 1   Iron , Ferrous Sulfate , 325 (65 Fe) MG TABS, Take 325 mg by mouth daily., Disp: 30 tablet, Rfl: 5   losartan  (COZAAR ) 25 MG tablet, Take 1 tablet (25 mg total) by mouth daily., Disp: 90 tablet, Rfl: 1   Multiple Vitamin (MULTI-VITAMINS) TABS, Take by mouth., Disp: , Rfl:    Omega-3 Fatty Acids (FISH OIL) 1000 MG CAPS, Take 1 capsule by mouth 3 (three) times a week., Disp: , Rfl:    PREMPRO 0.3-1.5 MG tablet, Take 1 tablet by mouth daily., Disp: , Rfl:    TURMERIC PO, Take by mouth., Disp: , Rfl:    zinc gluconate 50 MG tablet, Take 50 mg by mouth 2 (two) times a week., Disp: , Rfl:   Allergies  Allergen Reactions   Codeine Nausea Only and Other (See Comments)    Makes patient feel lethargic-can't move   Penicillins Nausea Only     ROS  Constitutional: Negative for fever  or weight change.  Respiratory: Negative for cough and shortness of breath.   Cardiovascular: Negative for chest pain or palpitations.  Gastrointestinal: Negative for abdominal pain, no bowel changes.  Musculoskeletal: Negative for gait problem or joint swelling.  Skin: Negative for  rash.  Neurological: Negative for dizziness or headache.  No other specific complaints in a complete review of systems (except as listed in HPI above).   Objective  Vitals:   12/21/23 1114  BP: 130/74  Pulse: 62  Resp: 16  SpO2: 97%  Weight: 198 lb 12.8 oz (90.2 kg)  Height: 5' 1.13 (1.553 m)    Body mass index is 37.41 kg/m.  Physical Exam  Constitutional: Patient appears well-developed and well-nourished. No distress.  HENT: Head: Normocephalic and atraumatic. Ears: B TMs ok, no erythema or effusion; Nose: Nose normal. Mouth/Throat: Oropharynx is clear and moist. No oropharyngeal exudate.  Eyes: Conjunctivae and EOM are normal. Pupils are equal, round, and reactive to light. No scleral icterus.  Neck: Normal range of motion. Neck supple. No JVD present. No thyromegaly present.  Cardiovascular: Normal rate, regular rhythm and normal heart sounds.  No murmur heard. No BLE edema. Pulmonary/Chest: Effort normal and breath sounds normal. No respiratory distress. Abdominal: Soft. Bowel sounds are normal, no distension. There is no tenderness. no masses Breast: no lumps or masses, no nipple discharge or rashes FEMALE GENITALIA:  Not done  RECTAL: not done  Musculoskeletal: Normal range of motion, no joint effusions. No gross deformities Neurological: he is alert and oriented to person, place, and time. No cranial nerve deficit. Coordination, balance, strength, speech and gait are normal.  Skin: Skin is warm and dry. No rash noted. No erythema.  Psychiatric: Patient has a normal mood and affect. behavior is normal. Judgment and thought content normal.      Assessment & Plan Well adult exam Resume regular physical activity Schedule mammogram Discussed healthier die t  Type 2 diabetes mellitus with dyslipidemia and HTN Type 2 diabetes mellitus with improved A1c to 5.8. Discussed metformin for weight control. - Continue dietary management with focus on carbohydrate  reduction. - Encourage resumption of regular exercise. - Check blood glucose and kidney function. - Perform urine microalbumin test.  Hypertension, obesity, DM syndrome  Hypertension controlled with losartan and hydrochlorothiazide. - Continue losartan 25 mg daily. - Continue hydrochlorothiazide 12.5 mg daily. - Encourage home blood pressure monitoring. - Discuss stress management techniques.  Morbid obesity (BMI >35) Morbid obesity with recent weight gain, BMI over 35 with DM. Prefers lifestyle modifications over medication. - Encourage resumption of regular exercise. - Continue dietary modifications with focus on portion control and carbohydrate reduction.  Dyslipidemia Dyslipidemia managed with atorvastatin. Due for medication refill. - Continue atorvastatin 20 mg daily. - Refill atorvastatin prescription. - Check cholesterol levels.  Neutropenia Chronic neutropenia with stable low white blood cell count. No new symptoms. - Monitor white blood cell count with CBC. - Consider referral to hematology if white blood cell count drops below 3.0.  History of iron deficiency anemia Iron deficiency anemia managed with supplements. Last ferritin level low normal. - Continue iron supplements three times a week. - Check CBC and ferritin levels.  Thyroid nodule Thyroid nodule monitored by ENT. No symptoms of dysfunction. - Continue follow-up with ENT specialist. - Undergo thyroid ultrasound on September 23.  Vitamin D deficiency Vitamin D deficiency managed with daily supplementation. - Continue daily vitamin D3 supplementation. - Check vitamin D levels.   -USPSTF grade A and B recommendations reviewed with  patient; age-appropriate recommendations, preventive care, screening tests, etc discussed and encouraged; healthy living encouraged; see AVS for patient education given to patient -Discussed importance of 150 minutes of physical activity weekly, eat two servings of fish weekly,  eat one serving of tree nuts ( cashews, pistachios, pecans, almonds.SABRA) every other day, eat 6 servings of fruit/vegetables daily and drink plenty of water and avoid sweet beverages.   -Reviewed Health Maintenance: Yes.

## 2023-12-22 ENCOUNTER — Ambulatory Visit: Payer: Self-pay | Admitting: Family Medicine

## 2023-12-22 LAB — HEMOGLOBIN A1C
Hgb A1c MFr Bld: 6.6 % — ABNORMAL HIGH (ref <5.7)
Mean Plasma Glucose: 143 mg/dL
eAG (mmol/L): 7.9 mmol/L

## 2023-12-22 LAB — CBC WITH DIFFERENTIAL/PLATELET
Absolute Lymphocytes: 1012 {cells}/uL (ref 850–3900)
Absolute Monocytes: 301 {cells}/uL (ref 200–950)
Basophils Absolute: 32 {cells}/uL (ref 0–200)
Basophils Relative: 0.9 %
Eosinophils Absolute: 49 {cells}/uL (ref 15–500)
Eosinophils Relative: 1.4 %
HCT: 39.9 % (ref 35.0–45.0)
Hemoglobin: 12.7 g/dL (ref 11.7–15.5)
MCH: 27.8 pg (ref 27.0–33.0)
MCHC: 31.8 g/dL — ABNORMAL LOW (ref 32.0–36.0)
MCV: 87.3 fL (ref 80.0–100.0)
MPV: 10.1 fL (ref 7.5–12.5)
Monocytes Relative: 8.6 %
Neutro Abs: 2107 {cells}/uL (ref 1500–7800)
Neutrophils Relative %: 60.2 %
Platelets: 287 Thousand/uL (ref 140–400)
RBC: 4.57 Million/uL (ref 3.80–5.10)
RDW: 14.2 % (ref 11.0–15.0)
Total Lymphocyte: 28.9 %
WBC: 3.5 Thousand/uL — ABNORMAL LOW (ref 3.8–10.8)

## 2023-12-22 LAB — COMPREHENSIVE METABOLIC PANEL WITH GFR
AG Ratio: 2 (calc) (ref 1.0–2.5)
ALT: 27 U/L (ref 6–29)
AST: 21 U/L (ref 10–35)
Albumin: 4.5 g/dL (ref 3.6–5.1)
Alkaline phosphatase (APISO): 88 U/L (ref 37–153)
BUN: 18 mg/dL (ref 7–25)
CO2: 33 mmol/L — ABNORMAL HIGH (ref 20–32)
Calcium: 10 mg/dL (ref 8.6–10.4)
Chloride: 99 mmol/L (ref 98–110)
Creat: 0.93 mg/dL (ref 0.60–1.00)
Globulin: 2.3 g/dL (ref 1.9–3.7)
Glucose, Bld: 100 mg/dL — ABNORMAL HIGH (ref 65–99)
Potassium: 4.2 mmol/L (ref 3.5–5.3)
Sodium: 139 mmol/L (ref 135–146)
Total Bilirubin: 0.4 mg/dL (ref 0.2–1.2)
Total Protein: 6.8 g/dL (ref 6.1–8.1)
eGFR: 66 mL/min/1.73m2 (ref >=60)

## 2023-12-22 LAB — MICROALBUMIN / CREATININE URINE RATIO
Creatinine, Urine: 42 mg/dL (ref 20–275)
Microalb, Ur: <0.2 mg/dL

## 2023-12-22 LAB — LIPID PANEL
Cholesterol: 138 mg/dL (ref <200)
HDL: 52 mg/dL (ref >=50)
LDL Cholesterol (Calc): 72 mg/dL
Non-HDL Cholesterol (Calc): 86 mg/dL (ref <130)
Total CHOL/HDL Ratio: 2.7 (calc) (ref <5.0)
Triglycerides: 62 mg/dL (ref <150)

## 2023-12-22 LAB — VITAMIN D 25 HYDROXY (VIT D DEFICIENCY, FRACTURES): Vit D, 25-Hydroxy: 47 ng/mL (ref 30–100)

## 2024-02-01 ENCOUNTER — Ambulatory Visit
Admission: RE | Admit: 2024-02-01 | Discharge: 2024-02-01 | Disposition: A | Discharge status: Home / Self Care | Source: Ambulatory Visit | Attending: Otolaryngology | Admitting: Otolaryngology

## 2024-02-01 DIAGNOSIS — E042 Nontoxic multinodular goiter: Secondary | ICD-10-CM | POA: Diagnosis not present

## 2024-02-01 DIAGNOSIS — E041 Nontoxic single thyroid nodule: Secondary | ICD-10-CM

## 2024-02-09 ENCOUNTER — Other Ambulatory Visit: Payer: Self-pay | Admitting: Otolaryngology

## 2024-02-09 DIAGNOSIS — E041 Nontoxic single thyroid nodule: Secondary | ICD-10-CM

## 2024-02-16 ENCOUNTER — Ambulatory Visit
Admission: RE | Admit: 2024-02-16 | Discharge: 2024-02-16 | Disposition: A | Discharge status: Home / Self Care | Source: Ambulatory Visit | Attending: Otolaryngology | Admitting: Otolaryngology

## 2024-02-16 VITALS — BP 126/67 | HR 57

## 2024-02-16 DIAGNOSIS — E041 Nontoxic single thyroid nodule: Secondary | ICD-10-CM

## 2024-02-16 DIAGNOSIS — E042 Nontoxic multinodular goiter: Secondary | ICD-10-CM | POA: Insufficient documentation

## 2024-02-16 DIAGNOSIS — E049 Nontoxic goiter, unspecified: Secondary | ICD-10-CM

## 2024-02-16 MED ORDER — LIDOCAINE HCL (PF) 1 % IJ SOLN
10.0000 mL | Freq: Once | INTRAMUSCULAR | Status: AC
  Administered 2024-02-16: 10 mL

## 2024-02-18 LAB — CYTOLOGY - NON PAP

## 2024-02-28 ENCOUNTER — Other Ambulatory Visit: Payer: Self-pay | Admitting: Emergency Medicine

## 2024-02-28 DIAGNOSIS — E119 Type 2 diabetes mellitus without complications: Secondary | ICD-10-CM

## 2024-03-07 LAB — OPHTHALMOLOGY REPORT-SCANNED

## 2024-03-22 ENCOUNTER — Other Ambulatory Visit: Payer: Self-pay | Admitting: Family Medicine

## 2024-03-22 DIAGNOSIS — L853 Xerosis cutis: Secondary | ICD-10-CM

## 2024-03-24 NOTE — Telephone Encounter (Signed)
 Requested Prescriptions  Pending Prescriptions Disp Refills   ammonium lactate  (LAC-HYDRIN ) 12 % lotion [Pharmacy Med Name: AMMONIUM LACTATE  12% LOTION] 400 mL 0    Sig: APPLY TOPICALLY AS NEEDED FOR DRY SKIN.     Dermatology:  Other Passed - 03/24/2024 10:49 AM      Passed - Valid encounter within last 12 months    Recent Outpatient Visits           3 months ago Well adult exam   [facility] Glenard Mire, MD   8 months ago Obesity, diabetes, and hypertension syndrome [facility])   [facility] Sowles, Krichna, MD

## 2024-03-28 ENCOUNTER — Ambulatory Visit
Admission: RE | Admit: 2024-03-28 | Discharge: 2024-03-28 | Disposition: A | Discharge status: Home / Self Care | Source: Ambulatory Visit | Attending: Family Medicine | Admitting: Family Medicine

## 2024-03-28 DIAGNOSIS — N6489 Other specified disorders of breast: Secondary | ICD-10-CM | POA: Diagnosis not present

## 2024-03-28 DIAGNOSIS — R928 Other abnormal and inconclusive findings on diagnostic imaging of breast: Secondary | ICD-10-CM | POA: Insufficient documentation

## 2024-03-28 DIAGNOSIS — Z1231 Encounter for screening mammogram for malignant neoplasm of breast: Secondary | ICD-10-CM | POA: Insufficient documentation

## 2024-03-28 DIAGNOSIS — R92323 Mammographic fibroglandular density, bilateral breasts: Secondary | ICD-10-CM | POA: Diagnosis not present

## 2024-04-03 DIAGNOSIS — Z780 Asymptomatic menopausal state: Secondary | ICD-10-CM | POA: Diagnosis not present

## 2024-04-03 DIAGNOSIS — Z79890 Hormone replacement therapy (postmenopausal): Secondary | ICD-10-CM | POA: Diagnosis not present

## 2024-04-03 DIAGNOSIS — Z01419 Encounter for gynecological examination (general) (routine) without abnormal findings: Secondary | ICD-10-CM | POA: Diagnosis not present

## 2024-04-03 DIAGNOSIS — Z8742 Personal history of other diseases of the female genital tract: Secondary | ICD-10-CM | POA: Diagnosis not present

## 2024-05-01 ENCOUNTER — Ambulatory Visit (INDEPENDENT_AMBULATORY_CARE_PROVIDER_SITE_OTHER)

## 2024-05-01 DIAGNOSIS — Z23 Encounter for immunization: Secondary | ICD-10-CM

## 2024-05-01 NOTE — Progress Notes (Signed)
 Patient is in office today for a nurse visit for Flu Immunization. Patient Injection was given in the  Left deltoid. Patient tolerated injection well.

## 2024-06-16 ENCOUNTER — Other Ambulatory Visit: Payer: Self-pay | Admitting: Family Medicine

## 2024-06-16 DIAGNOSIS — E119 Type 2 diabetes mellitus without complications: Secondary | ICD-10-CM

## 2024-06-16 NOTE — Telephone Encounter (Signed)
 Requested Prescriptions  Pending Prescriptions Disp Refills   losartan  (COZAAR ) 25 MG tablet [Pharmacy Med Name: LOSARTAN  POTASSIUM 25 MG TAB] 90 tablet 0    Sig: TAKE 1 TABLET (25 MG TOTAL) BY MOUTH DAILY.     Cardiovascular:  Angiotensin Receptor Blockers Passed - 06/16/2024  5:59 PM      Passed - Cr in normal range and within 180 days    Creat  Date Value Ref Range Status  12/21/2023 0.93 0.60 - 1.00 mg/dL Final   Creatinine, Urine  Date Value Ref Range Status  12/21/2023 42 20 - 275 mg/dL Final         Passed - K in normal range and within 180 days    Potassium  Date Value Ref Range Status  12/21/2023 4.2 3.5 - 5.3 mmol/L Final         Passed - Patient is not pregnant      Passed - Last BP in normal range    BP Readings from Last 1 Encounters:  02/16/24 126/67         Passed - Valid encounter within last 6 months    Recent Outpatient Visits           5 months ago Well adult exam   [facility] Glenard Mire, MD   11 months ago Obesity, diabetes, and hypertension syndrome [facility])   [facility] Rainelle, Krichna, MD               hydrochlorothiazide  (HYDRODIURIL ) 12.5 MG tablet [Pharmacy Med Name: HYDROCHLOROTHIAZIDE  12.5 MG TB] 90 tablet 0    Sig: TAKE 1 TABLET BY MOUTH EVERY DAY     Cardiovascular: Diuretics - Thiazide Passed - 06/16/2024  5:59 PM      Passed - Cr in normal range and within 180 days    Creat  Date Value Ref Range Status  12/21/2023 0.93 0.60 - 1.00 mg/dL Final   Creatinine, Urine  Date Value Ref Range Status  12/21/2023 42 20 - 275 mg/dL Final         Passed - K in normal range and within 180 days    Potassium  Date Value Ref Range Status  12/21/2023 4.2 3.5 - 5.3 mmol/L Final         Passed - Na in normal range and within 180 days    Sodium  Date Value Ref Range Status  12/21/2023 139 135 - 146 mmol/L Final  08/21/2016 143 137 - 147 mmol/L Final         Passed - Last BP in  normal range    BP Readings from Last 1 Encounters:  02/16/24 126/67         Passed - Valid encounter within last 6 months    Recent Outpatient Visits           5 months ago Well adult exam   [facility] Glenard Mire, MD   11 months ago Obesity, diabetes, and hypertension syndrome [facility])   [facility] Sowles, Krichna, MD

## 2024-06-27 ENCOUNTER — Ambulatory Visit: Admitting: Family Medicine

## 2024-07-06 ENCOUNTER — Ambulatory Visit: Payer: PPO
